# Patient Record
Sex: Female | Born: 1935 | Race: White | Hispanic: No | Marital: Married | Smoking: Former smoker
Health system: Southern US, Community
[De-identification: ages and names within clinical notes are randomized; demographics above are authoritative.]

## PROBLEM LIST (undated history)

## (undated) DIAGNOSIS — I4891 Unspecified atrial fibrillation: Secondary | ICD-10-CM

## (undated) DIAGNOSIS — I10 Essential (primary) hypertension: Secondary | ICD-10-CM

## (undated) DIAGNOSIS — E871 Hypo-osmolality and hyponatremia: Secondary | ICD-10-CM

## (undated) DIAGNOSIS — E785 Hyperlipidemia, unspecified: Secondary | ICD-10-CM

## (undated) DIAGNOSIS — C801 Malignant (primary) neoplasm, unspecified: Secondary | ICD-10-CM

## (undated) DIAGNOSIS — I509 Heart failure, unspecified: Secondary | ICD-10-CM

## (undated) DIAGNOSIS — Z7901 Long term (current) use of anticoagulants: Secondary | ICD-10-CM

## (undated) HISTORY — PX: ABDOMINAL HYSTERECTOMY: SHX81

## (undated) HISTORY — DX: Hyperlipidemia, unspecified: E78.5

## (undated) HISTORY — DX: Hypo-osmolality and hyponatremia: E87.1

## (undated) HISTORY — DX: Unspecified atrial fibrillation: I48.91

## (undated) HISTORY — PX: CHOLECYSTECTOMY: SHX55

## (undated) HISTORY — DX: Long term (current) use of anticoagulants: Z79.01

---

## 1997-09-03 ENCOUNTER — Other Ambulatory Visit: Admission: RE | Admit: 1997-09-03 | Discharge: 1997-09-03 | Payer: Self-pay | Admitting: Endocrinology

## 1998-02-13 ENCOUNTER — Inpatient Hospital Stay (HOSPITAL_COMMUNITY): Admission: AD | Admit: 1998-02-13 | Discharge: 1998-02-14 | Payer: Self-pay | Admitting: Internal Medicine

## 1998-11-05 ENCOUNTER — Other Ambulatory Visit: Admission: RE | Admit: 1998-11-05 | Discharge: 1998-11-05 | Payer: Self-pay | Admitting: Endocrinology

## 1998-11-12 ENCOUNTER — Ambulatory Visit (HOSPITAL_BASED_OUTPATIENT_CLINIC_OR_DEPARTMENT_OTHER): Admission: RE | Admit: 1998-11-12 | Discharge: 1998-11-12 | Payer: Self-pay | Admitting: Plastic Surgery

## 1999-10-27 ENCOUNTER — Other Ambulatory Visit: Admission: RE | Admit: 1999-10-27 | Discharge: 1999-10-27 | Payer: Self-pay | Admitting: Endocrinology

## 2000-11-07 ENCOUNTER — Other Ambulatory Visit: Admission: RE | Admit: 2000-11-07 | Discharge: 2000-11-07 | Payer: Self-pay | Admitting: Endocrinology

## 2002-08-27 ENCOUNTER — Encounter: Admission: RE | Admit: 2002-08-27 | Discharge: 2002-11-25 | Payer: Self-pay | Admitting: Endocrinology

## 2002-09-06 ENCOUNTER — Encounter: Payer: Self-pay | Admitting: Endocrinology

## 2002-09-06 ENCOUNTER — Encounter: Admission: RE | Admit: 2002-09-06 | Discharge: 2002-09-06 | Payer: Self-pay | Admitting: Endocrinology

## 2002-09-26 ENCOUNTER — Encounter (INDEPENDENT_AMBULATORY_CARE_PROVIDER_SITE_OTHER): Payer: Self-pay | Admitting: Specialist

## 2002-09-26 ENCOUNTER — Ambulatory Visit (HOSPITAL_COMMUNITY): Admission: RE | Admit: 2002-09-26 | Discharge: 2002-09-26 | Payer: Self-pay | Admitting: *Deleted

## 2002-10-30 ENCOUNTER — Ambulatory Visit (HOSPITAL_COMMUNITY): Admission: RE | Admit: 2002-10-30 | Discharge: 2002-10-30 | Payer: Self-pay | Admitting: *Deleted

## 2002-11-26 ENCOUNTER — Other Ambulatory Visit: Admission: RE | Admit: 2002-11-26 | Discharge: 2002-11-26 | Payer: Self-pay | Admitting: Endocrinology

## 2004-11-16 ENCOUNTER — Encounter: Admission: RE | Admit: 2004-11-16 | Discharge: 2004-11-16 | Payer: Self-pay | Admitting: Endocrinology

## 2004-11-17 ENCOUNTER — Encounter: Admission: RE | Admit: 2004-11-17 | Discharge: 2004-11-17 | Payer: Self-pay | Admitting: Endocrinology

## 2004-11-26 ENCOUNTER — Encounter: Admission: RE | Admit: 2004-11-26 | Discharge: 2004-11-26 | Payer: Self-pay | Admitting: Endocrinology

## 2005-01-12 ENCOUNTER — Encounter: Admission: RE | Admit: 2005-01-12 | Discharge: 2005-01-12 | Payer: Self-pay | Admitting: Neurosurgery

## 2005-01-14 ENCOUNTER — Encounter: Admission: RE | Admit: 2005-01-14 | Discharge: 2005-01-14 | Payer: Self-pay | Admitting: Neurosurgery

## 2005-04-10 ENCOUNTER — Inpatient Hospital Stay (HOSPITAL_COMMUNITY): Admission: EM | Admit: 2005-04-10 | Discharge: 2005-04-12 | Payer: Self-pay | Admitting: Emergency Medicine

## 2005-05-18 ENCOUNTER — Ambulatory Visit (HOSPITAL_COMMUNITY): Admission: RE | Admit: 2005-05-18 | Discharge: 2005-05-18 | Payer: Self-pay | Admitting: Surgery

## 2005-05-18 ENCOUNTER — Encounter (INDEPENDENT_AMBULATORY_CARE_PROVIDER_SITE_OTHER): Payer: Self-pay | Admitting: *Deleted

## 2007-03-16 ENCOUNTER — Encounter: Admission: RE | Admit: 2007-03-16 | Discharge: 2007-03-16 | Payer: Self-pay | Admitting: *Deleted

## 2008-11-13 ENCOUNTER — Other Ambulatory Visit: Admission: RE | Admit: 2008-11-13 | Discharge: 2008-11-13 | Payer: Self-pay | Admitting: Endocrinology

## 2010-03-30 ENCOUNTER — Other Ambulatory Visit: Payer: Self-pay | Admitting: Dermatology

## 2010-05-28 NOTE — Op Note (Signed)
Alexa Middleton, Alexa Middleton                 ACCOUNT NO.:  [id number]   MEDICAL RECORD NO.:  [id number]          PATIENT TYPE:  AMB   LOCATION:  DAY                          FACILITY:  [facility]   PHYSICIAN:  Wilmon Arms. Corliss Skains, M.D. DATE OF BIRTH:  Jan 06, 1928   DATE OF PROCEDURE:  05/18/2005  DATE OF DISCHARGE:                                 OPERATIVE REPORT   PREOPERATIVE DIAGNOSIS:  Chronic calculus cholecystitis.   POSTOPERATIVE DIAGNOSIS:  Chronic calculus cholecystitis.   PROCEDURE PERFORMED:  Laparoscopic cholecystectomy with intraoperative  cholangiogram.   SURGEON:  Wilmon Arms. Corliss Skains, M.D.   ASSISTANT:  Lebron Conners, M.D.   ANESTHESIA:  General endotracheal.   INDICATIONS:  The patient is a 77 year old female who was recently  hospitalized for right-sided chest pain.  She was found to have a right side  pneumonia, for which she was treated with antibiotics.  She is also noted on  ultrasound to have multiple gallstones including a 15 mm stone on the neck  of the gallbladder.  She had no further abdominal pain.  Her liver function  tests were normal.  She was seen in consultation and the decision was made  to proceed with a laparoscopic cholecystectomy.   DESCRIPTION OF PROCEDURE:  The patient is brought to the operating room and  placed in the supine position on the operating room table.  After an  adequate level of general endotracheal anesthesia was obtained, the  patient's abdomen was prepped with Betadine and draped in sterile fashion.  A time-out was then taken to ensure the proper patient and proper procedure.  The area below her umbilicus was infiltrated with 0.25% Marcaine and  transverse incision was made here.  Dissection was carried down to the  fascia which was opened vertically.  A pursestring suture of 0 Vicryl was  placed around the fascial opening.  A Hassan cannula was inserted and  secured with stay suture.  Pneumoperitoneum was obtained by insufflating  CO2,  maintaining maximal pressure 15 mmHg.  The patient was positioned in  reverse Trendelenburg position rotated slightly to her left.  The visual  examination showed a large uninflamed gallbladder.  The anterior surface of  her liver showed some whitish scarring.  There were no other abnormalities  noted on the liver.  Two 5 mm ports were placed on the right side and a 10  mm port was placed in subxiphoid position.  The gallbladder was grasped with  a clamp and elevated over the edge of the liver.  The peritoneum around the  hilum of the gallbladder was opened after taking down some flimsy omental  adhesions.  The cystic duct was circumferentially dissected and ligated with  a clip distally.  A small opening was created on the cystic duct and a Cook  cholangiogram catheter was inserted and secured with the clip.  A  cholangiogram was obtained which showed good flow proximally and distally in  the biliary tree with good flow into the duodenum.  No filling defects were  noted.  The cholangiogram catheter was removed and the cystic duct was  ligated with three clips and divided.  The cystic artery was also ligated  with clips and divided.  Cautery was then used to remove the gallbladder  from the liver bed.  The gallbladder was then removed through the umbilical  incision.  All the ports were then reinserted.  We reinspected the  gallbladder fossa and no bleeding was noted.  Ports were removed under  direct vision as pneumoperitoneum was released.  The pursestring suture was  used to close the umbilical fascia.  A 4-0  Monocryl was used to close the skin in subcuticular fashion.  All the ports  sites had been previously infiltrated 0.25% Marcaine.  Steri-Strips and  clean dressings were applied.  The patient was then extubated and brought to  the recovery room in stable condition.  All sponge, needle and instrument  counts were correct.      Wilmon Arms. Tsuei, M.D.  Electronically  Signed     MKT/MEDQ  D:  05/18/2005  T:  05/19/2005  Job:  [id number]   cc:   Brooke Bonito, M.D.  Fax: [phone]

## 2010-05-28 NOTE — Discharge Summary (Signed)
Alexa Middleton, WHINERY NO.:  [id number]   MEDICAL RECORD NO.:  [id number]          PATIENT TYPE:  INP   LOCATION:  5524                         FACILITY:  [facility]   PHYSICIAN:  Nelma Rothman, MD   DATE OF BIRTH:  01-30-1927   DATE OF ADMISSION:  04/10/2005  DATE OF DISCHARGE:  04/12/2005                                 DISCHARGE SUMMARY   PRIMARY CARE PHYSICIAN:  Brooke Bonito, M.D. of [facility]   DISCHARGE DIAGNOSES:  1.  Community-acquired right upper lobe pneumonia.  2.  Hypertension.  3.  History of dyslipidemia.   PROCEDURES:  1.  Two views of the abdomen, on admission, demonstrated no acute abdominal      process by plain film assessment.  2.  Abdominal ultrasound on April1 demonstrated cholelithiasis as well as 1      stone which appeared to be located in the neck of the gallbladder.      There was a positive ultrasound Murphy's sign.  However, there was no      evidence of cholecystitis as the common duct was well within normal      limits; and there was no gallbladder wall thickening.  3.  CT angiogram of the chest on April1 demonstrated a right upper lobar      pneumonia with consolidation, negative for pulmonary embolus, there was      multiple peripheral, subpleural blebs consistent with emphysema.   LABORATORY DATA ON ADMISSION:  White blood cell count was 26.6 this was  trending down with a white blood cell count of 17.7 on the morning of  discharge.  The D-dimer was slightly elevated on admission at 1.00.  Her  creatinine was 0.8.  Liver function tests were within normal limits.  The  hemoglobin A1c was 7.8.  TSH 0.690 and a vitamin B12 level of 680.   HISTORY AND PHYSICAL:  Please see dictated admission history and physical by  Dr. Robb Matar on Alex Gardener; but, briefly, Alexa Middleton is a very pleasant 77-  year-old lady who was admitted with fever as well as cough and some mild  abdominal pain.  CT on presentation was  consistent with a right upper lobe  pneumonia and she was admitted for further management.   HOSPITAL COURSE AS ARRANGED BY SYSTEMS:  1.  Community-acquired right upper lobe pneumonia.  The patient was started      on IV ceftriaxone and has completed 3 days of this so far.  With this,      her white blood cell count is trending down.  She has been afebrile      since arriving to the floor, despite having a fever of 101.2 in the      emergency department.  She has remained on room air throughout this      hospitalization.  At this time, I think it would be appropriate to      change her over to oral antibiotics to complete a total of a 10-day      course.  I have switched her to Avelox 400  mg daily for an additional 7      days.  2.  Cholelithiasis.  The patient did endorse a history of upper abdominal      pain which is intermittent in nature.  It resolved spontaneously while      she was here in the hospital.  It was not associated with any other      symptoms.  Acute abdominal films were negative for any sort of      obstruction.  However, abdominal ultrasound did demonstrate multiple      gallstones, one of which appeared to be at the neck of the gallbladder,      likely resulting in biliary colic accounting for her pain.  It has been      recommended that she undergo outpatient elective cholecystectomy at her      convenience.  I have given her the phone number for [facility]      [facility]; and she will speak with Dr. Juleen China for a referral for this.  3.  History of temporal arteritis.  The patient was maintained on prednisone      20 mg daily as she had been on previously for her hospitalization.  A      sed rate was checked during this admission, but is still pending.      Nevertheless, I think it will be difficult to interpret in the setting      of an acute infection, and so it is unlikely to be helpful in any event.      The patient states that she has had weekly sed rates  checked and      followed by Dr. Juleen China; and is scheduled to have another 1 in      approximately 2 days.  I have asked her to call Dr. Marylen Ponto office in      the next day or two to see if she should even have this drawn.  I am      concerned that an elevated ESR may not be secondary to a flare of her      temporal arteritis per se, but rather ongoing infection.  4.  Hypertension.  This has been well controlled on her home dose of Altace      throughout her hospitalization.  5.  Hyperglycemia.  The patient was noted to be mildly hyperglycemic on a.m.      laboratories.  A hemoglobin A1c was checked and was elevated at 7.8.  As      I suspect she will be on prednisone for some time for her temporal      arteritis, it would be reasonable to start a low dose of metformin to      increase insulin sensitivity.  However, I will leave this to the      discretion of Dr. Juleen China as she is followed closely.   DISCHARGE MEDICATIONS:  1.  Avelox 400 mg p.o. daily x7 additional days.  2.  Prednisone 20 mg p.o. daily until follow up with Dr. Juleen China on April18  3.  Altace 5 mg p.o. daily.  4.  Lipitor 40 mg p.o. daily.   DISCHARGE INSTRUCTIONS:  The patient should follow up with Dr. Juleen China, as  previously scheduled, on April18,2007.  I have also provided her with the  phone number for [facility] for referral for elective  cholecystectomy.  She will also speak with Dr. Juleen China about possibly starting  metformin for other oral hypoglycemic agent for presumed  steroid-induced  hyperglycemia.      Nelma Rothman, MD  Electronically Signed     RAR/MEDQ  D:  04/12/2005  T:  04/13/2005  Job:  [id number]   cc:   Brooke Bonito, M.D.  Fax: [phone]

## 2010-05-28 NOTE — Op Note (Signed)
   NAME:  [name], Alexa Middleton                           ACCOUNT NO.:  [id number]   MEDICAL RECORD NO.:  [id number]                   PATIENT TYPE:  AMB   LOCATION:  ENDO                                 FACILITY:  [facility]   PHYSICIAN:  Georgiana Spinner, M.D.                 DATE OF BIRTH:  1927/11/17   DATE OF PROCEDURE:  09/26/2002  DATE OF DISCHARGE:                                 OPERATIVE REPORT   PROCEDURE PERFORMED:  Upper endoscopy with biopsy.   ENDOSCOPIST:  Georgiana Spinner, M.D.   INDICATIONS FOR PROCEDURE:  Weight loss, abdominal pain.   ANESTHESIA:  Demerol 50 mg, Versed 6 mg.   DESCRIPTION OF PROCEDURE:  With the patient mildly sedated in the left  lateral decubitus position, the Olympus video endoscope was inserted in the  mouth and passed under direct vision through the esophagus which appeared  normal until we reached the distal esophagus and there was a stricture seen  that was benign but it was circumferential and it was photographed.  We  advanced past this area through a small hiatal hernia.  The fundus, body,  antrum, duodenal bulb and second portion of the duodenum were visualized and  appeared grossly normal.  From this point, the endoscope was slowly  withdrawn taking circumferential views of the duodenal mucosa visualized  until the endoscope was pulled back into the stomach and placed on  retroflexion to view the stomach from below and once again, the hiatal  hernia was visualized from below.  The endoscope was then straightened and  withdrawn taking circumferential views of the remaining gastric and  esophageal mucosa stopping in the distal esophagus distal to the stricture,  where it appeared that there may have been areas of Barrett's esophagus,  photographed and biopsies taken.  The endoscope was then withdrawn, taking  circumferential views of the remaining esophageal mucosa.  The patient's  vital signs and pulse oximeter remained stable.  The patient tolerated  the  procedure well without apparent complications.   FINDINGS:  Esophageal stricture, benign-appearing with question of Barrett's  esophagus just distal to this.   PLAN:  Await biopsy report.  The patient will call me for results and follow  up with me as an outpatient.                                               Georgiana Spinner, M.D.    GMO/MEDQ  D:  09/26/2002  T:  09/27/2002  Job:  [id number]

## 2010-05-28 NOTE — H&P (Signed)
NAMEKENESHIA, TENA NO.:  [id number]   MEDICAL RECORD NO.:  [id number]          PATIENT TYPE:  EMS   LOCATION:  MAJO                         FACILITY:  [facility]   PHYSICIAN:  Jonna L. Robb Matar, M.D.DATE OF BIRTH:  20-Jul-1927   DATE OF ADMISSION:  04/10/2005  DATE OF DISCHARGE:                                HISTORY & PHYSICAL   PRIMARY CARE PHYSICIAN:  Brooke Bonito, M.D.   NEUROLOGIST:  Gustavus Messing. Orlin Hilding, M.D.   CHIEF COMPLAINT:  Fever.   HISTORY OF PRESENT ILLNESS:  This 77 year old Caucasian female had a one-day  history of fever, slight nonproductive cough, shortness of breath.  No  nausea, vomiting, diarrhea, constipation.  She had some mild right upper  quadrant pain, but is seems to have gotten better.  No chest pain, no  hemoptysis, no history of aspiration.   PAST MEDICAL HISTORY:  1.  Diverticulosis.  2.  Esophageal stricture.  3.  COPD.  4.  Hypertension.  5.  Hypercholesterolemia.  6.  Temporal arteritis.  This was diagnosed several months ago because she      was having severe headaches, and her sed rate was 101.  She has been      treated, and the most recent sed rate is down to 19.   PAST SURGICAL HISTORY:  Hysterectomy with ovariectomy.  This was done for  fibroids.   FAMILY HISTORY:  Heart disease.  One sister has thyroid problems.  One  sister had an abdominal aortic aneurysm repair.   SOCIAL HISTORY:  She is an ex-smoker.  She quit in 1975.  Nondrinker.  No  drugs.  She has been married for 55 years and has one son.   ALLERGIES:  NONE.   MEDICATIONS:  1.  Altace 5 daily.  2.  Lasix 80 mg one-half to one daily p.r.n., and she says she has not been      using it.  Initially, when she went on some prednisone, she did have      some problems with edema, but she has not had any recently.  3.  Lipitor 40 daily.  4.  Prednisone 10 daily.  5.  Vicodin 5/500 p.r.n.   REVIEW OF SYSTEMS:  Apparently, she had a 30 pound weight loss  for  unexplained reasons.  She had a very thorough workup.  No source was ever  found, and then the patient regained the weight.  Other than the history of  present illness, the rest of the 12-system review is negative.   PHYSICAL EXAMINATION:  VITAL SIGNS:  Temperature 101.2, pulse 92,  respirations 22, blood pressure 126/52, 96% O2 saturation.  HEENT:  Conjunctivae and lids are normal.  Pupils are reactive.  Extraocular  movements are full.  She has normal hearing, mucosa, and pharynx.  NECK:  No mass, thyromegaly, or carotid bruits.  LUNGS:  Respiratory effort is normal.  The lungs have a few scattered  crackles on the right side.  No wheezing or dullness.  HEART:  Tachycardic.  Normal S1 and S2 without murmurs, rubs, or gallops.  There  were no bruits.  EXTREMITIES:  No clubbing, cyanosis, or edema.  BREASTS:  No masses, tenderness, or discharge.  ABDOMEN:  Very slight right upper quadrant soreness if you really push into  the gallbladder area.  There is no hepatosplenomegaly or hernia.  Genitalia  could not be examined since the patient was in the hallway.  NEUROLOGIC:  There was no cervical adenopathy.  Muscle strength is 5/5 with  full range of motion in all four extremities.  Cranial nerve are intact.  DTRs are 2+ and equal.  Sensation is normal.  The patient is alert and  oriented x3.  Normal memory, judgment, and affect.  SKIN:  No rash, lesions, or nodules.   LABORATORY DATA:  [facility]  revealed a white count of 20.9 and now up to 26.6; normal hemoglobin and  platelets.  Sodium 129, potassium 3.9, BUN 13, creatinine 1.1, glucose 179.  D-dimer 1.00.  LFTs and INR were normal.   EKG just showed a sinus tachycardia and incomplete right bundle branch  block.  Abdominal x-ray was normal.  Ultrasound of the gallbladder showed  multiple gallstones, normal duct caliber, and a positive Murphy's sign.  Chest x-ray at [facility] showed  right upper lobe consolidation, and it was  peripheral in a wedge shape, so they were concerned that she might have had  a pulmonary infarct.  CT of the chest here showed a dense right upper lobe  consolidation, no PE.  It also showed COPD and some interstitial fibrosis.   IMPRESSION:  1.  Right upper lobe pneumonia.  The patient will be started on Rocephin;      however, consideration should be given to changing to vancomycin if she      does not respond to the Rocephin.  2.  Chronic obstructive pulmonary disease.  3.  Cholelithiasis.  She does not really seem to have an acute cholecystitis      at present.  She should get an outpatient appointment with [facility] and have a cholecystectomy on an elective basis.  4.  Temporal arteritis.  I am going to double her prednisone for three days      and then taper back down so as to avoid any Addisonian reaction.  5.  Esophageal stricture.  6.  Hypertension.  7.  Hypercholesterolemia.      Jonna L. Robb Matar, M.D.  Electronically Signed     JLB/MEDQ  D:  04/10/2005  T:  04/12/2005  Job:  [id number]   cc:   Brooke Bonito, M.D.  Fax: [phone]   Gustavus Messing. Orlin Hilding, M.D.  Fax: [phone]

## 2010-05-28 NOTE — Op Note (Signed)
   NAME:  Alexa Middleton, Alexa Middleton                           ACCOUNT NO.:  [id number]   MEDICAL RECORD NO.:  [id number]                   PATIENT TYPE:  AMB   LOCATION:  ENDO                                 FACILITY:  [facility]   PHYSICIAN:  Georgiana Spinner, M.D.                 DATE OF BIRTH:  May 26, 1927   DATE OF PROCEDURE:  DATE OF DISCHARGE:                                 OPERATIVE REPORT   PROCEDURE:  Colonoscopy.   INDICATIONS:  Hemoccult-positivity.   ANESTHESIA:  Demerol 60 mg, Versed 6 mg.   DESCRIPTION OF PROCEDURE:  With the patient mildly sedated and in the left  lateral decubitus position, the Olympus videoscopic colonoscope was inserted  in the rectum and passed under direct vision to the cecum, identified by  ileocecal valve and appendiceal orifice, both of which were photographed.  From this point,  the colonoscope was slowly withdrawn, taking  circumferential views of the colonic mucosa, stopping only to photograph  diverticula seen in the sigmoid colon, until we reached the rectum which  appeared normal  on direct and showed hemorrhoids on retroflexed view.  The  endoscope was straightened and withdrawn.  The patient's vital signs and  pulse oximetry remained stable.  The patient tolerated the procedure well  without apparent complications.   FINDINGS:  1. Internal hemorrhoids.  2. Diverticulosis in the sigmoid colon.  3. Otherwise unremarkable exam.   PLAN:  Will have the patient follow up with me and her primary care  physician as an outpatient.                                               Georgiana Spinner, M.D.    GMO/MEDQ  D:  10/30/2002  T:  10/30/2002  Job:  [id number]

## 2011-04-18 ENCOUNTER — Other Ambulatory Visit: Payer: Self-pay | Admitting: Dermatology

## 2011-05-31 ENCOUNTER — Other Ambulatory Visit: Payer: Self-pay | Admitting: Dermatology

## 2011-10-17 ENCOUNTER — Other Ambulatory Visit: Payer: Self-pay | Admitting: Dermatology

## 2012-05-15 ENCOUNTER — Other Ambulatory Visit: Payer: Self-pay | Admitting: Dermatology

## 2012-11-01 ENCOUNTER — Other Ambulatory Visit: Payer: Self-pay | Admitting: Dermatology

## 2013-03-06 ENCOUNTER — Other Ambulatory Visit: Payer: Self-pay | Admitting: Dermatology

## 2013-03-19 ENCOUNTER — Other Ambulatory Visit: Payer: Self-pay | Admitting: Dermatology

## 2013-04-23 ENCOUNTER — Other Ambulatory Visit: Payer: Self-pay | Admitting: Dermatology

## 2013-06-18 ENCOUNTER — Other Ambulatory Visit: Payer: Self-pay | Admitting: Dermatology

## 2013-09-02 ENCOUNTER — Other Ambulatory Visit: Payer: Self-pay | Admitting: Dermatology

## 2013-09-05 ENCOUNTER — Inpatient Hospital Stay (HOSPITAL_COMMUNITY): Payer: Medicare Other | Admitting: Certified Registered Nurse Anesthetist

## 2013-09-05 ENCOUNTER — Emergency Department (HOSPITAL_COMMUNITY): Payer: Medicare Other

## 2013-09-05 ENCOUNTER — Encounter (HOSPITAL_COMMUNITY)
Admission: EM | Disposition: A | Discharge status: Skilled Nursing Facility | Payer: Self-pay | Source: Home / Self Care | Attending: Internal Medicine

## 2013-09-05 ENCOUNTER — Encounter (HOSPITAL_COMMUNITY): Payer: Medicare Other | Admitting: Certified Registered Nurse Anesthetist

## 2013-09-05 ENCOUNTER — Inpatient Hospital Stay (HOSPITAL_COMMUNITY)
Admission: EM | Admit: 2013-09-05 | Discharge: 2013-09-13 | DRG: 469 | Disposition: A | Discharge status: Skilled Nursing Facility | Payer: Medicare Other | Attending: Internal Medicine | Admitting: Internal Medicine

## 2013-09-05 ENCOUNTER — Encounter (HOSPITAL_COMMUNITY): Payer: Self-pay | Admitting: Emergency Medicine

## 2013-09-05 ENCOUNTER — Inpatient Hospital Stay (HOSPITAL_COMMUNITY): Payer: Medicare Other

## 2013-09-05 DIAGNOSIS — Z87891 Personal history of nicotine dependence: Secondary | ICD-10-CM

## 2013-09-05 DIAGNOSIS — Z8249 Family history of ischemic heart disease and other diseases of the circulatory system: Secondary | ICD-10-CM

## 2013-09-05 DIAGNOSIS — E785 Hyperlipidemia, unspecified: Secondary | ICD-10-CM

## 2013-09-05 DIAGNOSIS — E871 Hypo-osmolality and hyponatremia: Secondary | ICD-10-CM

## 2013-09-05 DIAGNOSIS — M25559 Pain in unspecified hip: Secondary | ICD-10-CM | POA: Diagnosis present

## 2013-09-05 DIAGNOSIS — E875 Hyperkalemia: Secondary | ICD-10-CM | POA: Diagnosis present

## 2013-09-05 DIAGNOSIS — W010XXA Fall on same level from slipping, tripping and stumbling without subsequent striking against object, initial encounter: Secondary | ICD-10-CM | POA: Diagnosis present

## 2013-09-05 DIAGNOSIS — J189 Pneumonia, unspecified organism: Secondary | ICD-10-CM

## 2013-09-05 DIAGNOSIS — Y92009 Unspecified place in unspecified non-institutional (private) residence as the place of occurrence of the external cause: Secondary | ICD-10-CM

## 2013-09-05 DIAGNOSIS — I10 Essential (primary) hypertension: Secondary | ICD-10-CM | POA: Diagnosis present

## 2013-09-05 DIAGNOSIS — I4892 Unspecified atrial flutter: Secondary | ICD-10-CM | POA: Diagnosis not present

## 2013-09-05 DIAGNOSIS — R000 Tachycardia, unspecified: Secondary | ICD-10-CM

## 2013-09-05 DIAGNOSIS — R7309 Other abnormal glucose: Secondary | ICD-10-CM | POA: Diagnosis not present

## 2013-09-05 DIAGNOSIS — I959 Hypotension, unspecified: Secondary | ICD-10-CM | POA: Diagnosis not present

## 2013-09-05 DIAGNOSIS — I4891 Unspecified atrial fibrillation: Secondary | ICD-10-CM

## 2013-09-05 DIAGNOSIS — D62 Acute posthemorrhagic anemia: Secondary | ICD-10-CM

## 2013-09-05 DIAGNOSIS — Z96649 Presence of unspecified artificial hip joint: Secondary | ICD-10-CM

## 2013-09-05 DIAGNOSIS — R0902 Hypoxemia: Secondary | ICD-10-CM | POA: Diagnosis present

## 2013-09-05 DIAGNOSIS — I484 Atypical atrial flutter: Secondary | ICD-10-CM

## 2013-09-05 DIAGNOSIS — Z85828 Personal history of other malignant neoplasm of skin: Secondary | ICD-10-CM | POA: Diagnosis not present

## 2013-09-05 DIAGNOSIS — I472 Ventricular tachycardia: Secondary | ICD-10-CM | POA: Diagnosis not present

## 2013-09-05 DIAGNOSIS — J690 Pneumonitis due to inhalation of food and vomit: Secondary | ICD-10-CM | POA: Diagnosis present

## 2013-09-05 DIAGNOSIS — S72012A Unspecified intracapsular fracture of left femur, initial encounter for closed fracture: Secondary | ICD-10-CM

## 2013-09-05 DIAGNOSIS — S72033A Displaced midcervical fracture of unspecified femur, initial encounter for closed fracture: Secondary | ICD-10-CM | POA: Diagnosis present

## 2013-09-05 DIAGNOSIS — J438 Other emphysema: Secondary | ICD-10-CM | POA: Diagnosis present

## 2013-09-05 DIAGNOSIS — D72829 Elevated white blood cell count, unspecified: Secondary | ICD-10-CM

## 2013-09-05 DIAGNOSIS — I480 Paroxysmal atrial fibrillation: Secondary | ICD-10-CM

## 2013-09-05 DIAGNOSIS — S72009A Fracture of unspecified part of neck of unspecified femur, initial encounter for closed fracture: Secondary | ICD-10-CM | POA: Diagnosis present

## 2013-09-05 DIAGNOSIS — I498 Other specified cardiac arrhythmias: Secondary | ICD-10-CM | POA: Diagnosis present

## 2013-09-05 HISTORY — DX: Essential (primary) hypertension: I10

## 2013-09-05 HISTORY — DX: Malignant (primary) neoplasm, unspecified: C80.1

## 2013-09-05 HISTORY — PX: HIP ARTHROPLASTY: SHX981

## 2013-09-05 LAB — CBC WITH DIFFERENTIAL/PLATELET
Basophils Absolute: 0 10*3/uL (ref 0.0–0.1)
Basophils Relative: 0 % (ref 0–1)
EOS PCT: 2 % (ref 0–5)
Eosinophils Absolute: 0.2 10*3/uL (ref 0.0–0.7)
HCT: 39.5 % (ref 36.0–46.0)
HEMOGLOBIN: 13.3 g/dL (ref 12.0–15.0)
LYMPHS ABS: 0.8 10*3/uL (ref 0.7–4.0)
LYMPHS PCT: 8 % — AB (ref 12–46)
MCH: 29.2 pg (ref 26.0–34.0)
MCHC: 33.7 g/dL (ref 30.0–36.0)
MCV: 86.8 fL (ref 78.0–100.0)
Monocytes Absolute: 0.6 10*3/uL (ref 0.1–1.0)
Monocytes Relative: 6 % (ref 3–12)
NEUTROS PCT: 84 % — AB (ref 43–77)
Neutro Abs: 8.4 10*3/uL — ABNORMAL HIGH (ref 1.7–7.7)
PLATELETS: 308 10*3/uL (ref 150–400)
RBC: 4.55 MIL/uL (ref 3.87–5.11)
RDW: 14.6 % (ref 11.5–15.5)
WBC: 10.1 10*3/uL (ref 4.0–10.5)

## 2013-09-05 LAB — TYPE AND SCREEN
ABO/RH(D): O POS
ANTIBODY SCREEN: NEGATIVE

## 2013-09-05 LAB — URINALYSIS, ROUTINE W REFLEX MICROSCOPIC
Bilirubin Urine: NEGATIVE
Glucose, UA: NEGATIVE mg/dL
Hgb urine dipstick: NEGATIVE
Ketones, ur: NEGATIVE mg/dL
Leukocytes, UA: NEGATIVE
Nitrite: NEGATIVE
Protein, ur: NEGATIVE mg/dL
Specific Gravity, Urine: 1.014 (ref 1.005–1.030)
Urobilinogen, UA: 0.2 mg/dL (ref 0.0–1.0)
pH: 6 (ref 5.0–8.0)

## 2013-09-05 LAB — SURGICAL PCR SCREEN
MRSA, PCR: NEGATIVE
Staphylococcus aureus: NEGATIVE

## 2013-09-05 LAB — BASIC METABOLIC PANEL
Anion gap: 13 (ref 5–15)
BUN: 12 mg/dL (ref 6–23)
CO2: 26 meq/L (ref 19–32)
Calcium: 8.9 mg/dL (ref 8.4–10.5)
Chloride: 92 mEq/L — ABNORMAL LOW (ref 96–112)
Creatinine, Ser: 0.56 mg/dL (ref 0.50–1.10)
GFR calc Af Amer: >90 mL/min (ref >90)
GFR calc non Af Amer: 83 mL/min — ABNORMAL LOW (ref >90)
Glucose, Bld: 191 mg/dL — ABNORMAL HIGH (ref 70–99)
POTASSIUM: 4.2 meq/L (ref 3.7–5.3)
SODIUM: 131 meq/L — AB (ref 137–147)

## 2013-09-05 LAB — ABO/RH: ABO/RH(D): O POS

## 2013-09-05 SURGERY — HEMIARTHROPLASTY, HIP, DIRECT ANTERIOR APPROACH, FOR FRACTURE
Anesthesia: General | Site: Hip | Laterality: Left

## 2013-09-05 MED ORDER — ACETAMINOPHEN 325 MG PO TABS: 650.0000 mg | ORAL_TABLET | Freq: Four times a day (QID) | ORAL | Status: DC | PRN

## 2013-09-05 MED ORDER — MORPHINE SULFATE 2 MG/ML IJ SOLN: 2.0000 mg | INTRAMUSCULAR | Status: DC | PRN

## 2013-09-05 MED ORDER — ROCURONIUM BROMIDE 100 MG/10ML IV SOLN
INTRAVENOUS | Status: DC | PRN
  Administered 2013-09-05: 20 mg via INTRAVENOUS
  Administered 2013-09-05: 10 mg via INTRAVENOUS

## 2013-09-05 MED ORDER — ONDANSETRON HCL 4 MG PO TABS: 4.0000 mg | ORAL_TABLET | Freq: Four times a day (QID) | ORAL | Status: DC | PRN

## 2013-09-05 MED ORDER — FENTANYL CITRATE 0.05 MG/ML IJ SOLN
50.0000 ug | Freq: Once | INTRAMUSCULAR | Status: AC
  Administered 2013-09-05: 50 ug via INTRAVENOUS
  Filled 2013-09-05: qty 2

## 2013-09-05 MED ORDER — ATROPINE SULFATE 0.4 MG/ML IJ SOLN
INTRAMUSCULAR | Status: AC
  Filled 2013-09-05: qty 1

## 2013-09-05 MED ORDER — ONDANSETRON HCL 4 MG/2ML IJ SOLN
INTRAMUSCULAR | Status: AC
  Filled 2013-09-05: qty 2

## 2013-09-05 MED ORDER — LACTATED RINGERS IV SOLN: INTRAVENOUS | Status: DC

## 2013-09-05 MED ORDER — MORPHINE SULFATE 4 MG/ML IJ SOLN
4.0000 mg | Freq: Once | INTRAMUSCULAR | Status: AC
  Administered 2013-09-05: 4 mg via INTRAVENOUS
  Filled 2013-09-05: qty 1

## 2013-09-05 MED ORDER — GLYCOPYRROLATE 0.2 MG/ML IJ SOLN
INTRAMUSCULAR | Status: DC | PRN
  Administered 2013-09-05: 0.4 mg via INTRAVENOUS

## 2013-09-05 MED ORDER — SODIUM CHLORIDE 0.9 % IJ SOLN
INTRAMUSCULAR | Status: DC | PRN
  Administered 2013-09-05: 30 mL via INTRAVENOUS

## 2013-09-05 MED ORDER — ALUM & MAG HYDROXIDE-SIMETH 200-200-20 MG/5ML PO SUSP: 30.0000 mL | Freq: Four times a day (QID) | ORAL | Status: DC | PRN

## 2013-09-05 MED ORDER — BUPIVACAINE HCL (PF) 0.25 % IJ SOLN
INTRAMUSCULAR | Status: AC
  Filled 2013-09-05: qty 30

## 2013-09-05 MED ORDER — HYDROCODONE-ACETAMINOPHEN 5-325 MG PO TABS: 1.0000 | ORAL_TABLET | ORAL | Status: DC | PRN

## 2013-09-05 MED ORDER — ATORVASTATIN CALCIUM 10 MG PO TABS: 10.0000 mg | ORAL_TABLET | Freq: Every day | ORAL | Status: DC

## 2013-09-05 MED ORDER — ONDANSETRON HCL 4 MG/2ML IJ SOLN: 4.0000 mg | Freq: Three times a day (TID) | INTRAMUSCULAR | Status: DC | PRN

## 2013-09-05 MED ORDER — BUPIVACAINE HCL (PF) 0.25 % IJ SOLN
INTRAMUSCULAR | Status: DC | PRN
  Administered 2013-09-05: 20 mL

## 2013-09-05 MED ORDER — LIDOCAINE HCL (CARDIAC) 20 MG/ML IV SOLN
INTRAVENOUS | Status: DC | PRN
  Administered 2013-09-05: 60 mg via INTRAVENOUS

## 2013-09-05 MED ORDER — SODIUM CHLORIDE 0.9 % IV SOLN: INTRAVENOUS | Status: DC

## 2013-09-05 MED ORDER — SODIUM CHLORIDE 0.9 % IV SOLN
INTRAVENOUS | Status: DC
  Administered 2013-09-05: 18:00:00 via INTRAVENOUS

## 2013-09-05 MED ORDER — LOSARTAN POTASSIUM 50 MG PO TABS
100.0000 mg | ORAL_TABLET | Freq: Every day | ORAL | Status: DC
  Administered 2013-09-06 – 2013-09-08 (×3): 100 mg via ORAL
  Filled 2013-09-05 (×3): qty 2

## 2013-09-05 MED ORDER — SODIUM CHLORIDE 0.9 % IJ SOLN
INTRAMUSCULAR | Status: AC
  Filled 2013-09-05: qty 50

## 2013-09-05 MED ORDER — SODIUM CHLORIDE 0.9 % IV SOLN
250.0000 mL | INTRAVENOUS | Status: DC | PRN
  Administered 2013-09-05: 22:00:00 via INTRAVENOUS

## 2013-09-05 MED ORDER — FENTANYL CITRATE 0.05 MG/ML IJ SOLN
INTRAMUSCULAR | Status: AC
  Filled 2013-09-05: qty 5

## 2013-09-05 MED ORDER — FENTANYL CITRATE 0.05 MG/ML IJ SOLN
INTRAMUSCULAR | Status: DC | PRN
  Administered 2013-09-05 (×4): 50 ug via INTRAVENOUS

## 2013-09-05 MED ORDER — ENOXAPARIN SODIUM 40 MG/0.4ML ~~LOC~~ SOLN
40.0000 mg | SUBCUTANEOUS | Status: DC
  Filled 2013-09-05: qty 0.4

## 2013-09-05 MED ORDER — ATORVASTATIN CALCIUM 10 MG PO TABS
10.0000 mg | ORAL_TABLET | Freq: Every day | ORAL | Status: DC
  Administered 2013-09-06 – 2013-09-11 (×6): 10 mg via ORAL
  Filled 2013-09-05 (×10): qty 1

## 2013-09-05 MED ORDER — ROCURONIUM BROMIDE 100 MG/10ML IV SOLN
INTRAVENOUS | Status: AC
  Filled 2013-09-05: qty 1

## 2013-09-05 MED ORDER — CEFAZOLIN SODIUM-DEXTROSE 2-3 GM-% IV SOLR
INTRAVENOUS | Status: AC
  Filled 2013-09-05: qty 50

## 2013-09-05 MED ORDER — NEOSTIGMINE METHYLSULFATE 10 MG/10ML IV SOLN
INTRAVENOUS | Status: DC | PRN
  Administered 2013-09-05: 3 mg via INTRAVENOUS

## 2013-09-05 MED ORDER — SODIUM CHLORIDE 0.9 % IJ SOLN: 3.0000 mL | INTRAMUSCULAR | Status: DC | PRN

## 2013-09-05 MED ORDER — AMLODIPINE BESYLATE 10 MG PO TABS
10.0000 mg | ORAL_TABLET | Freq: Every day | ORAL | Status: DC
  Administered 2013-09-06 – 2013-09-08 (×3): 10 mg via ORAL
  Filled 2013-09-05 (×3): qty 1

## 2013-09-05 MED ORDER — AMLODIPINE BESYLATE 10 MG PO TABS: 10.0000 mg | ORAL_TABLET | Freq: Every day | ORAL | Status: DC

## 2013-09-05 MED ORDER — SENNOSIDES-DOCUSATE SODIUM 8.6-50 MG PO TABS: 1.0000 | ORAL_TABLET | Freq: Every evening | ORAL | Status: DC | PRN

## 2013-09-05 MED ORDER — BUPIVACAINE LIPOSOME 1.3 % IJ SUSP: 20.0000 mL | Freq: Once | INTRAMUSCULAR | Status: DC

## 2013-09-05 MED ORDER — HYDROMORPHONE HCL PF 1 MG/ML IJ SOLN: 0.2500 mg | INTRAMUSCULAR | Status: DC | PRN

## 2013-09-05 MED ORDER — ONDANSETRON HCL 4 MG/2ML IJ SOLN
4.0000 mg | Freq: Four times a day (QID) | INTRAMUSCULAR | Status: DC | PRN
Start: 2013-09-05 — End: 2013-09-06

## 2013-09-05 MED ORDER — PHENYLEPHRINE HCL 10 MG/ML IJ SOLN
INTRAMUSCULAR | Status: DC | PRN
  Administered 2013-09-05 (×4): 80 ug via INTRAVENOUS

## 2013-09-05 MED ORDER — DEXAMETHASONE SODIUM PHOSPHATE 10 MG/ML IJ SOLN
INTRAMUSCULAR | Status: DC | PRN
  Administered 2013-09-05: 10 mg via INTRAVENOUS

## 2013-09-05 MED ORDER — CEFAZOLIN SODIUM-DEXTROSE 2-3 GM-% IV SOLR
2.0000 g | INTRAVENOUS | Status: AC
Start: 2013-09-05 — End: 2013-09-05
  Administered 2013-09-05: 2 g via INTRAVENOUS

## 2013-09-05 MED ORDER — PROPOFOL 10 MG/ML IV BOLUS
INTRAVENOUS | Status: AC
  Filled 2013-09-05: qty 20

## 2013-09-05 MED ORDER — BUPIVACAINE LIPOSOME 1.3 % IJ SUSP
20.0000 mL | Freq: Once | INTRAMUSCULAR | Status: AC
  Administered 2013-09-05: 20 mL
  Filled 2013-09-05: qty 20

## 2013-09-05 MED ORDER — ACETAMINOPHEN 650 MG RE SUPP: 650.0000 mg | Freq: Four times a day (QID) | RECTAL | Status: DC | PRN

## 2013-09-05 MED ORDER — ONDANSETRON HCL 4 MG/2ML IJ SOLN
INTRAMUSCULAR | Status: DC | PRN
  Administered 2013-09-05: 4 mg via INTRAVENOUS

## 2013-09-05 MED ORDER — LOSARTAN POTASSIUM 50 MG PO TABS: 100.0000 mg | ORAL_TABLET | Freq: Every day | ORAL | Status: DC

## 2013-09-05 MED ORDER — ACETAMINOPHEN 10 MG/ML IV SOLN
INTRAVENOUS | Status: DC | PRN
  Administered 2013-09-05: 1000 mg via INTRAVENOUS

## 2013-09-05 MED ORDER — ONDANSETRON HCL 4 MG/2ML IJ SOLN
4.0000 mg | Freq: Once | INTRAMUSCULAR | Status: AC
  Administered 2013-09-05: 4 mg via INTRAVENOUS
  Filled 2013-09-05: qty 2

## 2013-09-05 MED ORDER — TETANUS-DIPHTH-ACELL PERTUSSIS 5-2.5-18.5 LF-MCG/0.5 IM SUSP
0.5000 mL | Freq: Once | INTRAMUSCULAR | Status: AC
  Administered 2013-09-05: 0.5 mL via INTRAMUSCULAR
  Filled 2013-09-05: qty 0.5

## 2013-09-05 MED ORDER — SODIUM CHLORIDE 0.9 % IJ SOLN
3.0000 mL | Freq: Two times a day (BID) | INTRAMUSCULAR | Status: DC
  Administered 2013-09-07 – 2013-09-13 (×13): 3 mL via INTRAVENOUS

## 2013-09-05 MED ORDER — PROPOFOL 10 MG/ML IV BOLUS
INTRAVENOUS | Status: DC | PRN
  Administered 2013-09-05: 20 mg via INTRAVENOUS
  Administered 2013-09-05: 120 mg via INTRAVENOUS

## 2013-09-05 MED ORDER — SUCCINYLCHOLINE CHLORIDE 20 MG/ML IJ SOLN
INTRAMUSCULAR | Status: DC | PRN
  Administered 2013-09-05: 80 mg via INTRAVENOUS

## 2013-09-05 MED ORDER — LACTATED RINGERS IV SOLN
INTRAVENOUS | Status: DC | PRN
  Administered 2013-09-05: 22:00:00 via INTRAVENOUS

## 2013-09-05 SURGICAL SUPPLY — 51 items
BAG ZIPLOCK 12X15 (MISCELLANEOUS) ×3 IMPLANT
BIT DRILL 2.8X128 (BIT) ×2 IMPLANT
BIT DRILL 2.8X128MM (BIT) ×1
BLADE SAW SAG 73X25 THK (BLADE) ×2
BLADE SAW SGTL 73X25 THK (BLADE) ×1 IMPLANT
BRUSH FEMORAL CANAL (MISCELLANEOUS) ×3 IMPLANT
CAPT HIP FX BIPOLAR/UNIPOLAR ×3 IMPLANT
CEMENT BONE DEPUY (Cement) ×6 IMPLANT
CEMENT RESTRICTOR DEPUY SZ 3 (Cement) ×3 IMPLANT
CLOSURE WOUND 1/2 X4 (GAUZE/BANDAGES/DRESSINGS) ×1
DRAPE INCISE IOBAN 66X45 STRL (DRAPES) ×3 IMPLANT
DRAPE ORTHO SPLIT 77X108 STRL (DRAPES) ×2
DRAPE POUCH INSTRU U-SHP 10X18 (DRAPES) ×3 IMPLANT
DRAPE SURG ORHT 6 SPLT 77X108 (DRAPES) ×1 IMPLANT
DRAPE U-SHAPE 47X51 STRL (DRAPES) ×3 IMPLANT
DRSG EMULSION OIL 3X16 NADH (GAUZE/BANDAGES/DRESSINGS) ×3 IMPLANT
DRSG MEPILEX BORDER 4X4 (GAUZE/BANDAGES/DRESSINGS) ×3 IMPLANT
DRSG MEPILEX BORDER 4X8 (GAUZE/BANDAGES/DRESSINGS) ×3 IMPLANT
DRSG PAD ABDOMINAL 8X10 ST (GAUZE/BANDAGES/DRESSINGS) ×3 IMPLANT
DURAPREP 26ML APPLICATOR (WOUND CARE) ×3 IMPLANT
ELECT REM PT RETURN 9FT ADLT (ELECTROSURGICAL) ×3
ELECTRODE REM PT RTRN 9FT ADLT (ELECTROSURGICAL) ×1 IMPLANT
EVACUATOR 1/8 PVC DRAIN (DRAIN) ×3 IMPLANT
FACESHIELD WRAPAROUND (MASK) ×9 IMPLANT
GAUZE SPONGE 4X4 12PLY STRL (GAUZE/BANDAGES/DRESSINGS) ×6 IMPLANT
GLOVE BIO SURGEON STRL SZ7.5 (GLOVE) ×3 IMPLANT
GLOVE BIO SURGEON STRL SZ8 (GLOVE) ×6 IMPLANT
GLOVE BIOGEL PI IND STRL 8 (GLOVE) ×1 IMPLANT
GLOVE BIOGEL PI INDICATOR 8 (GLOVE) ×2
GOWN STRL REUS W/TWL LRG LVL3 (GOWN DISPOSABLE) ×3 IMPLANT
GOWN STRL REUS W/TWL XL LVL3 (GOWN DISPOSABLE) ×3 IMPLANT
HANDPIECE INTERPULSE COAX TIP (DISPOSABLE) ×2
IMMOBILIZER KNEE 20 (SOFTGOODS)
IMMOBILIZER KNEE 20 THIGH 36 (SOFTGOODS) IMPLANT
KIT BASIN OR (CUSTOM PROCEDURE TRAY) ×3 IMPLANT
MANIFOLD NEPTUNE II (INSTRUMENTS) ×3 IMPLANT
PACK TOTAL JOINT (CUSTOM PROCEDURE TRAY) ×3 IMPLANT
PADDING CAST COTTON 6X4 STRL (CAST SUPPLIES) ×6 IMPLANT
PASSER SUT SWANSON 36MM LOOP (INSTRUMENTS) ×3 IMPLANT
POSITIONER SURGICAL ARM (MISCELLANEOUS) ×3 IMPLANT
PRESSURIZER FEMORAL UNIV (MISCELLANEOUS) ×3 IMPLANT
SET HNDPC FAN SPRY TIP SCT (DISPOSABLE) ×1 IMPLANT
STRIP CLOSURE SKIN 1/2X4 (GAUZE/BANDAGES/DRESSINGS) ×2 IMPLANT
SUT ETHIBOND NAB CT1 #1 30IN (SUTURE) ×6 IMPLANT
SUT MNCRL AB 4-0 PS2 18 (SUTURE) ×3 IMPLANT
SUT VIC AB 1 CT1 27 (SUTURE) ×6
SUT VIC AB 1 CT1 27XBRD ANTBC (SUTURE) ×3 IMPLANT
SUT VIC AB 2-0 CT1 27 (SUTURE) ×6
SUT VIC AB 2-0 CT1 TAPERPNT 27 (SUTURE) ×3 IMPLANT
TOWEL OR 17X26 10 PK STRL BLUE (TOWEL DISPOSABLE) ×6 IMPLANT
TOWER CARTRIDGE SMART MIX (DISPOSABLE) ×3 IMPLANT

## 2013-09-05 NOTE — ED Notes (Signed)
Pt reports unsure of a number to rate pain. Pt reports left hip hurts a lot and left knee hurts a little.

## 2013-09-05 NOTE — ED Notes (Signed)
Bed: WA02 Expected date:  Expected time:  Means of arrival:  Comments: ems- 80 yo F, hip and knee pain

## 2013-09-05 NOTE — Op Note (Signed)
OPERATIVE REPORT   PREOPERATIVE DIAGNOSIS:  Left femoral neck fracture displaced.   POSTOPERATIVE DIAGNOSIS:  Left femoral neck fracture displaced.   PROCEDURE:  Left hip hemiarthroplasty.   SURGEON: Gaynelle Arabian, M.D.   ASSISTANT: Arlee Muslim, PA-C  ANESTHESIA: General  ESTIMATED BLOOD LOSS:100 ml    DRAINS: Hemovac x1.   COMPLICATIONS: None.   CONDITION: Stable to recovery.   BRIEF CLINICAL NOTE: Alexa Middleton is an 80 y.o. female   who had a fall today leading to a displaced   Left femoral neck fracture. She was evaluated in the emergency  department and admitted to the medical service. She was cleared for  surgery and presents for left hip hemiarthroplasty.   PROCEDURE IN DETAIL: After successful administration of general  anesthetic, the patient was placed in the Right lateral decubitus  position with the Left side up and held with the hip positioner.Left lower extremity was isolated from her perineum with plastic drapes and  prepped and draped in the usual sterile fashion. Short posterolateral  incision was made with a 10 blade through the subcutaneous tissue to the  level of fascia lata which was incised in line with the skin incision.  The sciatic nerve was palpated and protected, and short rotators and  capsule were isolated off the femur. There was complete fracture of the  femoral neck. The femoral head was removed. Diameters 42 mm. We then  isolated the femur with retractors and freshened up the femoral neck cut  with an oscillating saw. I used a starter reamer to get into the  femoral canal and then irrigated with saline. A lateralizing reamer was  then placed. We then broached up to a size 3, which had excellent  rotational and torsional stability. Trial neck for the size 3 with a  28 +1.5 head and 42 bipolar was placed. Hip was reduced with excellent  stability. I placed the right leg on top of the left leg, and the length was short. I placed a + 5 head and  the leg lengths  were equal. Hip was dislocated. All trials were removed. We trialed  the cement restrictor, and size 3 was most appropriate. The size 3  cement restrictor was placed at the appropriate depth in the femoral  canal. Canal was then prepared with pulsatile lavage and thoroughly  dried. Cement was mixed and once ready implantation was injected into  the femoral canal and pressurized. The size 3 [place] basic  cemented stem was then cemented into place in about 20 degrees of  anteversion. When the cement was fully hardened, then the permanent 28 + 5  head and 42 bipolar components were placed. Hip was reduced with  same stability parameters. Capsule and short rotators reattached to the  femur through drill holes with Ethibond suture. Fascia lata was closed  over Hemovac drain with running #1 V-Loc suture. Subcu closed with 2-0  Vicryl, and subcuticular running 4-0 Monocryl. The drains were hooked  to suction. Incision was cleaned and dried and Steri-Strips and a bulky  sterile dressing applied. She was then placed into a knee immobilizer,  awakened and transported to recovery in stable condition.   Gaynelle Arabian, M.D.

## 2013-09-05 NOTE — ED Provider Notes (Signed)
Assumed pt pat care from Dr Wyvonnia Dusky. Imaging significant for L subcapital hip fx. Closed injury. No blood thinners. Will consult orthopedics. Will consult medicine for admission. Will place foley and continue to manage pain while in ED. Pt/husband informed of plan.  Discussed with Dr Wynelle Link. May potentially do surgery tonight. NPO.   Discussed with Dr Wynelle Cleveland, medicine. Admission to med/surg.   Virgel Manifold, MD 09/05/13 [phone]

## 2013-09-05 NOTE — Transfer of Care (Signed)
Immediate Anesthesia Transfer of Care Note  Patient: Alexa Middleton  Procedure(s) Performed: Procedure(s): ARTHROPLASTY BIPOLAR HIP (Left)  Patient Location: PACU  Anesthesia Type:General  Level of Consciousness: awake, alert , oriented and patient cooperative  Airway & Oxygen Therapy: Patient Spontanous Breathing and Patient connected to face mask oxygen  Post-op Assessment: Report given to PACU RN, Post -op Vital signs reviewed and stable and Patient moving all extremities X 4  Post vital signs: stable  Complications: No apparent anesthesia complications

## 2013-09-05 NOTE — ED Notes (Signed)
Husband has belongings bag and pt jewelry.

## 2013-09-05 NOTE — ED Notes (Signed)
Pt reports decrease in pain in left hip but severe pain to left knee. Hospitalist at bedside and aware of pt pain status.

## 2013-09-05 NOTE — H&P (Signed)
[facility] History and Physical  Alexa Middleton OMV:[id number] DOB: 1927-04-03 DOA: 09/05/2013   PCP:  Gareth Eagle   Chief Complaint: hip fracture  HPI: Alexa Middleton is a 80 y.o. female with PMH of HTN and HLP who lost her footing and fell today. She subsequently began to have left hip and leg pain and was unable to walk. She was brought to the ER and on imaging is found to have a left hip fracture. Currently her only complaint is pain in the knee.     General: The patient denies anorexia, fever, weight loss Cardiac: Denies chest pain, syncope, palpitations, + h/o pedal edema  Respiratory: Denies cough, shortness of breath, wheezing GI: Denies severe indigestion/heartburn, abdominal pain, nausea, vomiting, diarrhea and constipation GU: Denies hematuria, incontinence, dysuria  Musculoskeletal: has mild arthritis  Skin: Denies suspicious skin lesions Neurologic: Denies focal weakness or numbness, change in vision  Past Medical History  Diagnosis Date  . Cancer     skin cancer to left leg    Past Surgical History  Procedure Laterality Date  . Cholecystectomy    . Abdominal hysterectomy      Social History: non-smoker, drinks a glass of wine a day Lives at home with husband.    No Known Allergies  History reviewed. No pertinent family history.    Prior to Admission medications   Medication Sig Start Date End Date Taking? Authorizing Provider  amLODipine (NORVASC) 10 MG tablet Take 10 mg by mouth daily.   Yes Historical Provider, MD  atorvastatin (LIPITOR) 10 MG tablet Take 10 mg by mouth daily at 6 PM.   Yes Historical Provider, MD  losartan (COZAAR) 100 MG tablet Take 100 mg by mouth daily.   Yes Historical Provider, MD     Physical Exam: Filed Vitals:   09/05/13 1355 09/05/13 1410 09/05/13 1621 09/05/13 1711  BP: 153/46  161/59 131/69  Pulse: 106  96 96  Temp: 98.3 F (36.8 C)   98.1 F (36.7 C)  TempSrc: Oral   Oral  Resp: 20  18 18   SpO2: 89% 98%  100% 99%     General: AAO x 3, no distress HEENT: Normocephalic and Atraumatic, Mucous membranes pink                PERRLA; EOM intact; No scleral icterus,                 Nares: Patent, Oropharynx: Clear, Fair Dentition                 Neck: FROM, no cervical lymphadenopathy, thyromegaly, carotid bruit or JVD;  Breasts: deferred CHEST WALL: No tenderness  CHEST: Normal respiration, clear to auscultation bilaterally  HEART: Regular rate and rhythm; no murmurs rubs or gallops  BACK: No kyphosis or scoliosis; no CVA tenderness  ABDOMEN: Positive Bowel Sounds, soft, non-tender; no masses, no organomegaly Rectal Exam: deferred EXTREMITIES: No cyanosis, clubbing, or edema Genitalia: not examined  SKIN:  no rash or ulceration  CNS: Alert and Oriented x 4, Nonfocal exam, CN 2-12 intact  Labs on Admission:  Basic Metabolic Panel:  Recent Labs Lab 09/05/13 1413  NA 131*  K 4.2  CL 92*  CO2 26  GLUCOSE 191*  BUN 12  CREATININE 0.56  CALCIUM 8.9   Liver Function Tests: No results found for this basename: AST, ALT, ALKPHOS, BILITOT, PROT, ALBUMIN,  in the last 168 hours No results found for this basename: LIPASE, AMYLASE,  in the last 168  hours No results found for this basename: AMMONIA,  in the last 168 hours CBC:  Recent Labs Lab 09/05/13 1413  WBC 10.1  NEUTROABS 8.4*  HGB 13.3  HCT 39.5  MCV 86.8  PLT 308   Cardiac Enzymes: No results found for this basename: CKTOTAL, CKMB, CKMBINDEX, TROPONINI,  in the last 168 hours  BNP (last 3 results) No results found for this basename: PROBNP,  in the last 8760 hours CBG: No results found for this basename: GLUCAP,  in the last 168 hours  Radiological Exams on Admission: Dg Chest 2 View  09/05/2013   CLINICAL DATA:  Preoperative hip surgery  EXAM: CHEST  2 VIEW  COMPARISON:  July 16, 2009  FINDINGS: Underlying emphysematous changes present. There is scarring in both upper lobes and base regions. There is no frank edema or  consolidation. The heart size is within normal limits. Pulmonary vascularity reflects underlying emphysema. No adenopathy. There is atherosclerotic change in the aorta. No bone lesions.  IMPRESSION: Emphysema with areas of scarring bilaterally. No appreciable edema or consolidation.   Electronically Signed   By: Lowella Grip M.D.   On: 09/05/2013 15:53   Dg Elbow Complete Left  09/05/2013   CLINICAL DATA:  Status post fall.  EXAM: LEFT ELBOW - COMPLETE 3+ VIEW  COMPARISON:  None.  FINDINGS: There is no evidence of fracture, dislocation, or joint effusion. There is no evidence of arthropathy or other focal bone abnormality. Soft tissues are unremarkable.  IMPRESSION: Negative exam.   Electronically Signed   By: Inge Rise M.D.   On: 09/05/2013 15:54   Dg Hip Complete Left  09/05/2013   CLINICAL DATA:  Status post fall.  Left hip pain.  EXAM: LEFT HIP - COMPLETE 2+ VIEW  COMPARISON:  None.  FINDINGS: The patient has an acute subcapital fracture of the left hip. No other acute bony or joint abnormality is identified. Lower lumbar degenerative change is noted.  IMPRESSION: Acute subcapital fracture left hip.   Electronically Signed   By: Inge Rise M.D.   On: 09/05/2013 15:59   Dg Knee 1-2 Views Left  09/05/2013   CLINICAL DATA:  Pain.  EXAM: LEFT KNEE - 1-2 VIEW  COMPARISON:  None.  FINDINGS: Soft tissue structures are unremarkable. Mild tricompartment degenerative change. No evidence of fracture or dislocation .  IMPRESSION: Mild tricompartment degenerative change.  No acute abnormality.   Electronically Signed   By: Marcello Moores  Register   On: 09/05/2013 15:54    EKG: Independently reviewed. Sinus tach with inverted T wave in V2.   Assessment/Plan Principal Problem:   Subcapital fracture of left hip - management per orthopedic surgery - low to moderate risk for surgery  Active Problems:   HTN (hypertension) - resume Cozaar and Amlodipine  Mild hyponatremia - will start slow IVF-  follow    Hyperlipidemia - resume statin    Consulted: Ortho consulted by ER  Code Status: Full code Family Communication: none  DVT Prophylaxis:Lovenox  Time spent: >45 min  [place], MD [facility]  If 7PM-7AM, please contact night-coverage [email or web address] 09/05/2013, 5:20 PM

## 2013-09-05 NOTE — Interval H&P Note (Signed)
History and Physical Interval Note:  09/05/2013 9:32 PM  Alexa Middleton  has presented today for surgery, with the diagnosis of LEFT HIP FRACTURE  The various methods of treatment have been discussed with the patient and family. After consideration of risks, benefits and other options for treatment, the patient has consented to  Procedure(s): ARTHROPLASTY BIPOLAR HIP (Left) as a surgical intervention .  The patient's history has been reviewed, patient examined, no change in status, stable for surgery.  I have reviewed the patient's chart and labs.  Questions were answered to the patient's satisfaction.     Gearlean Alf

## 2013-09-05 NOTE — Consult Note (Signed)
Reason for Consult:  Left hip pain Referring Physician: Dr. Gweneth Dimitri LEVONIA WOLFLEY is an 80 y.o. female.  HPI: Patient is an 80 year old female who was in her own yard earlier today looking at some shrubs when she unfortunately lost her balance and fell sustaining injury to her left hip.  She had immediate onset of pain and was unable to stand or get up.  Her husband was home with her and EMS services was contacted.  She was brought to [facility] ED where xrays showed she sustained a displaced left femoral neck fracture.  She was admitted by [facility] and Dr. Wynelle Link was consulted for orthopedic care.  Due to the nature of the fracture, it was recommended that the patient undergo surgery to fix the hip fracture.  She was admitted for pan control and set up for surgery.  Past Medical History  Diagnosis Date  . Cancer     skin cancer to left leg  . Hypertension     Past Surgical History  Procedure Laterality Date  . Cholecystectomy    . Abdominal hysterectomy      History reviewed. No pertinent family history.  Social History:  reports that she quit smoking about 45 years ago. She has never used smokeless tobacco. She reports that she drinks about 4.2 ounces of alcohol per week. She reports that she does not use illicit drugs.  Allergies: No Known Allergies  Medications:  Amlodipine Lipitor Cozaar  Results for orders placed during the hospital encounter of 09/05/13 (from the past 48 hour(s))  CBC WITH DIFFERENTIAL     Status: Abnormal   Collection Time    09/05/13  2:13 PM      Result Value Ref Range   WBC 10.1  4.0 - 10.5 K/uL   RBC 4.55  3.87 - 5.11 MIL/uL   Hemoglobin 13.3  12.0 - 15.0 g/dL   HCT 39.5  36.0 - 46.0 %   MCV 86.8  78.0 - 100.0 fL   MCH 29.2  26.0 - 34.0 pg   MCHC 33.7  30.0 - 36.0 g/dL   RDW 14.6  11.5 - 15.5 %   Platelets 308  150 - 400 K/uL   Neutrophils Relative % 84 (*) 43 - 77 %   Neutro Abs 8.4 (*) 1.7 - 7.7 K/uL   Lymphocytes Relative 8 (*) 12 -  46 %   Lymphs Abs 0.8  0.7 - 4.0 K/uL   Monocytes Relative 6  3 - 12 %   Monocytes Absolute 0.6  0.1 - 1.0 K/uL   Eosinophils Relative 2  0 - 5 %   Eosinophils Absolute 0.2  0.0 - 0.7 K/uL   Basophils Relative 0  0 - 1 %   Basophils Absolute 0.0  0.0 - 0.1 K/uL  BASIC METABOLIC PANEL     Status: Abnormal   Collection Time    09/05/13  2:13 PM      Result Value Ref Range   Sodium 131 (*) 137 - 147 mEq/L   Potassium 4.2  3.7 - 5.3 mEq/L   Chloride 92 (*) 96 - 112 mEq/L   CO2 26  19 - 32 mEq/L   Glucose, Bld 191 (*) 70 - 99 mg/dL   BUN 12  6 - 23 mg/dL   Creatinine, Ser 0.56  0.50 - 1.10 mg/dL   Calcium 8.9  8.4 - 10.5 mg/dL   GFR calc non Af Amer 83 (*) >90 mL/min   GFR calc  Af Amer >90  >90 mL/min   Comment: (NOTE)     The eGFR has been calculated using the CKD EPI equation.     This calculation has not been validated in all clinical situations.     eGFR's persistently <90 mL/min signify possible Chronic Kidney     Disease.   Anion gap 13  5 - 15  URINALYSIS, ROUTINE W REFLEX MICROSCOPIC     Status: None   Collection Time    09/05/13  4:30 PM      Result Value Ref Range   Color, Urine YELLOW  YELLOW   APPearance CLEAR  CLEAR   Specific Gravity, Urine 1.014  1.005 - 1.030   pH 6.0  5.0 - 8.0   Glucose, UA NEGATIVE  NEGATIVE mg/dL   Hgb urine dipstick NEGATIVE  NEGATIVE   Bilirubin Urine NEGATIVE  NEGATIVE   Ketones, ur NEGATIVE  NEGATIVE mg/dL   Protein, ur NEGATIVE  NEGATIVE mg/dL   Urobilinogen, UA 0.2  0.0 - 1.0 mg/dL   Nitrite NEGATIVE  NEGATIVE   Leukocytes, UA NEGATIVE  NEGATIVE   Comment: MICROSCOPIC NOT DONE ON URINES WITH NEGATIVE PROTEIN, BLOOD, LEUKOCYTES, NITRITE, OR GLUCOSE <1000 mg/dL.  SURGICAL PCR SCREEN     Status: None   Collection Time    09/05/13  5:58 PM      Result Value Ref Range   MRSA, PCR NEGATIVE  NEGATIVE   Staphylococcus aureus NEGATIVE  NEGATIVE   Comment:            The Xpert SA Assay (FDA     approved for NASAL specimens     in  patients over 40 years of age),     is one component of     a comprehensive surveillance     program.  Test performance has     been validated by [employer] for patients greater     than or equal to 58 year old.     It is not intended     to diagnose infection nor to     guide or monitor treatment.    Dg Chest 2 View  09/05/2013   CLINICAL DATA:  Preoperative hip surgery  EXAM: CHEST  2 VIEW  COMPARISON:  July 16, 2009  FINDINGS: Underlying emphysematous changes present. There is scarring in both upper lobes and base regions. There is no frank edema or consolidation. The heart size is within normal limits. Pulmonary vascularity reflects underlying emphysema. No adenopathy. There is atherosclerotic change in the aorta. No bone lesions.  IMPRESSION: Emphysema with areas of scarring bilaterally. No appreciable edema or consolidation.   Electronically Signed   By: Lowella Grip M.D.   On: 09/05/2013 15:53   Dg Elbow Complete Left  09/05/2013   CLINICAL DATA:  Status post fall.  EXAM: LEFT ELBOW - COMPLETE 3+ VIEW  COMPARISON:  None.  FINDINGS: There is no evidence of fracture, dislocation, or joint effusion. There is no evidence of arthropathy or other focal bone abnormality. Soft tissues are unremarkable.  IMPRESSION: Negative exam.   Electronically Signed   By: Inge Rise M.D.   On: 09/05/2013 15:54   Dg Hip Complete Left  09/05/2013   CLINICAL DATA:  Status post fall.  Left hip pain.  EXAM: LEFT HIP - COMPLETE 2+ VIEW  COMPARISON:  None.  FINDINGS: The patient has an acute subcapital fracture of the left hip. No other acute bony or joint abnormality is identified.  Lower lumbar degenerative change is noted.  IMPRESSION: Acute subcapital fracture left hip.   Electronically Signed   By: Inge Rise M.D.   On: 09/05/2013 15:59   Dg Knee 1-2 Views Left  09/05/2013   CLINICAL DATA:  Pain.  EXAM: LEFT KNEE - 1-2 VIEW  COMPARISON:  None.  FINDINGS: Soft tissue structures are  unremarkable. Mild tricompartment degenerative change. No evidence of fracture or dislocation .  IMPRESSION: Mild tricompartment degenerative change.  No acute abnormality.   Electronically Signed   By: Marcello Moores  Register   On: 09/05/2013 15:54    ROS Blood pressure 135/46, pulse 82, temperature 98.3 F (36.8 C), temperature source Oral, resp. rate 18, height _0  (1.575 m), weight 53.071 kg (117 lb), SpO2 97.00%. Physical Exam  80 year old female seen in hospital bed at [facility]. She is alert, oriented, and cooperative. No acute distress  Left Leg - NVI to the left lower extremity. Motor - moving foot and toes well. Sensation intact. Left leg is shortened and externally rotated  Radiographs - displaced left subcapital femoral neck fracture  Assessment/Plan: Left Femoral Neck Fracture  Plan - admitted by medicine service. Plan for a left hip hemiarthroplasty as per Dr. Levon Hedger, [place] 09/05/2013, 9:06 PM

## 2013-09-05 NOTE — ED Notes (Signed)
Per EMS pt tripped in yard resulting in left hip and left knee pain. Pt denies LOC, CP, or SOB. Pt not on blood thinners. Pt unable to bear weight to left leg. Moderate pedal pulses bilaterally. No deformity noted.

## 2013-09-05 NOTE — ED Provider Notes (Signed)
CSN: [id number]     Arrival date & time 09/05/13  1353 History   First MD Initiated Contact with Patient 09/05/13 1358     Chief Complaint  Patient presents with  . Knee Pain  . Hip Pain     (Consider location/radiation/quality/duration/timing/severity/associated sxs/prior Treatment) HPI Comments: Patient complains of pain to left knee and left hip after falling in her yard. She states this was a mechanical trip and fall. Denies hitting head or losing consciousness. Is not taking any blood thinners. Is unable to bear weight on her left leg. She denies any neck or back pain. Mild hypoxia at 89% for EMS. Denies any chest pain or shortness of breath. Denies any abdominal pain. Denies any focal weakness, numbness or tingling.  The history is provided by the patient and the EMS personnel.    Past Medical History  Diagnosis Date  . Cancer     skin cancer to left leg   Past Surgical History  Procedure Laterality Date  . Cholecystectomy    . Abdominal hysterectomy     History reviewed. No pertinent family history. History  Substance Use Topics  . Smoking status: Former Smoker    Quit date: 09/05/1968  . Smokeless tobacco: Never Used  . Alcohol Use: 4.2 oz/week    7 Glasses of wine per week     Comment: 1  glass wine daily   OB History   Grav Para Term Preterm Abortions TAB SAB Ect Mult Living                 Review of Systems  Constitutional: Negative for fever, activity change and appetite change.  HENT: Negative for congestion and rhinorrhea.   Eyes: Negative for visual disturbance.  Respiratory: Negative for cough, chest tightness and shortness of breath.   Cardiovascular: Negative for chest pain.  Gastrointestinal: Negative for nausea, vomiting and abdominal pain.  Genitourinary: Negative for dysuria, hematuria, vaginal bleeding and vaginal discharge.  Musculoskeletal: Positive for arthralgias, back pain and myalgias. Negative for neck pain.  Skin: Negative for rash.   Neurological: Negative for dizziness, weakness and headaches.  A complete 10 system review of systems was obtained and all systems are negative except as noted in the HPI and PMH.      Allergies  Review of patient's allergies indicates no known allergies.  Home Medications   Prior to Admission medications   Medication Sig Start Date End Date Taking? Authorizing Provider  amLODipine (NORVASC) 10 MG tablet Take 10 mg by mouth daily.   Yes Historical Provider, MD  atorvastatin (LIPITOR) 10 MG tablet Take 10 mg by mouth daily at 6 PM.   Yes Historical Provider, MD  losartan (COZAAR) 100 MG tablet Take 100 mg by mouth daily.   Yes Historical Provider, MD   BP 161/59  Pulse 96  Temp(Src) 98.3 F (36.8 C) (Oral)  Resp 18  SpO2 100% Physical Exam  Constitutional: She is oriented to person, place, and time. She appears well-developed and well-nourished. No distress.  HENT:  Head: Normocephalic and atraumatic.  Mouth/Throat: Oropharynx is clear and moist. No oropharyngeal exudate.  Eyes: Conjunctivae and EOM are normal. Pupils are equal, round, and reactive to light.  Neck: Normal range of motion. Neck supple.  No C spine pain.  Cardiovascular: Normal rate, regular rhythm and normal heart sounds.   No murmur heard. Pulmonary/Chest: Effort normal and breath sounds normal. No respiratory distress.  Abdominal: Soft. There is no tenderness. There is no rebound and no guarding.  Musculoskeletal: She exhibits tenderness. She exhibits no edema.  Leg is shortened and externally rotated. Pain with any palpation of the hip. Unable to range. Pain to left lateral knee Intact DP and PT pulses No T. or L-spine pain  Skin tear L elbow without bony tenderness  Neurological: She is alert and oriented to person, place, and time. No cranial nerve deficit. She exhibits normal muscle tone. Coordination normal.  Skin: Skin is warm.    ED Course  Procedures (including critical care time) Labs  Review Labs Reviewed  CBC WITH DIFFERENTIAL - Abnormal; Notable for the following:    Neutrophils Relative % 84 (*)    Neutro Abs 8.4 (*)    Lymphocytes Relative 8 (*)    All other components within normal limits  BASIC METABOLIC PANEL - Abnormal; Notable for the following:    Sodium 131 (*)    Chloride 92 (*)    Glucose, Bld 191 (*)    GFR calc non Af Amer 83 (*)    All other components within normal limits  URINALYSIS, ROUTINE W REFLEX MICROSCOPIC    Imaging Review Dg Chest 2 View  09/05/2013   CLINICAL DATA:  Preoperative hip surgery  EXAM: CHEST  2 VIEW  COMPARISON:  July 16, 2009  FINDINGS: Underlying emphysematous changes present. There is scarring in both upper lobes and base regions. There is no frank edema or consolidation. The heart size is within normal limits. Pulmonary vascularity reflects underlying emphysema. No adenopathy. There is atherosclerotic change in the aorta. No bone lesions.  IMPRESSION: Emphysema with areas of scarring bilaterally. No appreciable edema or consolidation.   Electronically Signed   By: Lowella Grip M.D.   On: 09/05/2013 15:53   Dg Elbow Complete Left  09/05/2013   CLINICAL DATA:  Status post fall.  EXAM: LEFT ELBOW - COMPLETE 3+ VIEW  COMPARISON:  None.  FINDINGS: There is no evidence of fracture, dislocation, or joint effusion. There is no evidence of arthropathy or other focal bone abnormality. Soft tissues are unremarkable.  IMPRESSION: Negative exam.   Electronically Signed   By: Inge Rise M.D.   On: 09/05/2013 15:54   Dg Hip Complete Left  09/05/2013   CLINICAL DATA:  Status post fall.  Left hip pain.  EXAM: LEFT HIP - COMPLETE 2+ VIEW  COMPARISON:  None.  FINDINGS: The patient has an acute subcapital fracture of the left hip. No other acute bony or joint abnormality is identified. Lower lumbar degenerative change is noted.  IMPRESSION: Acute subcapital fracture left hip.   Electronically Signed   By: Inge Rise M.D.   On:  09/05/2013 15:59   Dg Knee 1-2 Views Left  09/05/2013   CLINICAL DATA:  Pain.  EXAM: LEFT KNEE - 1-2 VIEW  COMPARISON:  None.  FINDINGS: Soft tissue structures are unremarkable. Mild tricompartment degenerative change. No evidence of fracture or dislocation .  IMPRESSION: Mild tricompartment degenerative change.  No acute abnormality.   Electronically Signed   By: Marcello Moores  Register   On: 09/05/2013 15:54     EKG Interpretation   Date/Time:  Thursday September 05 2013 14:21:43 EDT Ventricular Rate:  106 PR Interval:  211 QRS Duration: 92 QT Interval:  342 QTC Calculation: 454 R Axis:   -15 Text Interpretation:  Sinus tachycardia Borderline prolonged PR interval  Left atrial enlargement Borderline left axis deviation Anteroseptal  infarct, age indeterminate No significant change was found Confirmed by  Wyvonnia Dusky  MD, Yoona Ishii [id number]) on 09/05/2013 2:25:30 PM  MDM   Final diagnoses:  Subcapital fracture of left hip, closed, initial encounter   Mechanical fall with left hip and knee pain. No loss of consciousness.  EKG, labs, Xrays.  CXR given hypoxia. Update tetanus. Smoking history but no diagnosed COPD.  Suspect L hip fracture.  Xrays pending at time of sign out to Dr. Wilson Singer.    Ezequiel Essex, MD 09/05/13 [phone]

## 2013-09-05 NOTE — ED Notes (Signed)
Per MD turn of oxygen to see what pt is on RA. Pt O2 saturation 88% on RA. 2 lpm [place] placed.

## 2013-09-05 NOTE — Anesthesia Postprocedure Evaluation (Signed)
  Anesthesia Post-op Note  Patient: Alexa Middleton  Procedure(s) Performed: Procedure(s) (LRB): ARTHROPLASTY BIPOLAR HIP (Left)  Patient Location: PACU  Anesthesia Type: General  Level of Consciousness: awake and alert   Airway and Oxygen Therapy: Patient Spontanous Breathing  Post-op Pain: mild  Post-op Assessment: Post-op Vital signs reviewed, Patient's Cardiovascular Status Stable, Respiratory Function Stable, Patent Airway and No signs of Nausea or vomiting  Last Vitals:  Filed Vitals:   09/05/13 2330  BP: 129/38  Pulse: 91  Temp:   Resp: 23    Post-op Vital Signs: stable   Complications: No apparent anesthesia complications

## 2013-09-05 NOTE — ED Notes (Signed)
Pt transported to xray 

## 2013-09-05 NOTE — Anesthesia Preprocedure Evaluation (Addendum)
Anesthesia Evaluation  Patient identified by MRN, date of birth, ID band Patient awake    Reviewed: Allergy & Precautions, H&P , NPO status , Patient's Chart, lab work & pertinent test results  Airway Mallampati: II TM Distance: >3 FB Neck ROM: full    Dental no notable dental hx.    Pulmonary COPDformer smoker,  CXR - emphysema breath sounds clear to auscultation  Pulmonary exam normal       Cardiovascular hypertension, Pt. on medications Rhythm:regular Rate:Normal     Neuro/Psych negative neurological ROS  negative psych ROS   GI/Hepatic negative GI ROS, Neg liver ROS,   Endo/Other  negative endocrine ROS  Renal/GU negative Renal ROS  negative genitourinary   Musculoskeletal   Abdominal   Peds  Hematology negative hematology ROS (+)   Anesthesia Other Findings   Reproductive/Obstetrics negative OB ROS                         Anesthesia Physical Anesthesia Plan  ASA: III  Anesthesia Plan: General   Post-op Pain Management:    Induction: Intravenous  Airway Management Planned: Oral ETT  Additional Equipment:   Intra-op Plan:   Post-operative Plan: Extubation in OR  Informed Consent: I have reviewed the patients History and Physical, chart, labs and discussed the procedure including the risks, benefits and alternatives for the proposed anesthesia with the patient or authorized representative who has indicated his/her understanding and acceptance.   Dental Advisory Given  Plan Discussed with: CRNA and Surgeon  Anesthesia Plan Comments:         Anesthesia Quick Evaluation

## 2013-09-06 ENCOUNTER — Encounter (HOSPITAL_COMMUNITY): Payer: Self-pay | Admitting: Orthopedic Surgery

## 2013-09-06 DIAGNOSIS — D72829 Elevated white blood cell count, unspecified: Secondary | ICD-10-CM

## 2013-09-06 DIAGNOSIS — D62 Acute posthemorrhagic anemia: Secondary | ICD-10-CM | POA: Diagnosis present

## 2013-09-06 DIAGNOSIS — S72033A Displaced midcervical fracture of unspecified femur, initial encounter for closed fracture: Principal | ICD-10-CM

## 2013-09-06 DIAGNOSIS — E871 Hypo-osmolality and hyponatremia: Secondary | ICD-10-CM | POA: Diagnosis present

## 2013-09-06 LAB — CBC
HEMATOCRIT: 35.8 % — AB (ref 36.0–46.0)
Hemoglobin: 11.8 g/dL — ABNORMAL LOW (ref 12.0–15.0)
MCH: 29.4 pg (ref 26.0–34.0)
MCHC: 33 g/dL (ref 30.0–36.0)
MCV: 89.1 fL (ref 78.0–100.0)
PLATELETS: 249 10*3/uL (ref 150–400)
RBC: 4.02 MIL/uL (ref 3.87–5.11)
RDW: 14.6 % (ref 11.5–15.5)
WBC: 11.1 10*3/uL — AB (ref 4.0–10.5)

## 2013-09-06 LAB — BASIC METABOLIC PANEL
ANION GAP: 9 (ref 5–15)
BUN: 9 mg/dL (ref 6–23)
CHLORIDE: 96 meq/L (ref 96–112)
CO2: 26 mEq/L (ref 19–32)
CREATININE: 0.51 mg/dL (ref 0.50–1.10)
Calcium: 7.9 mg/dL — ABNORMAL LOW (ref 8.4–10.5)
GFR calc Af Amer: >90 mL/min (ref >90)
GFR calc non Af Amer: 86 mL/min — ABNORMAL LOW (ref >90)
Glucose, Bld: 156 mg/dL — ABNORMAL HIGH (ref 70–99)
Potassium: 5.4 mEq/L — ABNORMAL HIGH (ref 3.7–5.3)
Sodium: 131 mEq/L — ABNORMAL LOW (ref 137–147)

## 2013-09-06 MED ORDER — TRAMADOL HCL 50 MG PO TABS: 50.0000 mg | ORAL_TABLET | Freq: Four times a day (QID) | ORAL | Status: DC | PRN

## 2013-09-06 MED ORDER — ONDANSETRON HCL 4 MG PO TABS: 4.0000 mg | ORAL_TABLET | Freq: Four times a day (QID) | ORAL | Status: DC | PRN

## 2013-09-06 MED ORDER — METHOCARBAMOL 500 MG PO TABS: 500.0000 mg | ORAL_TABLET | Freq: Four times a day (QID) | ORAL | Status: DC | PRN

## 2013-09-06 MED ORDER — ENOXAPARIN SODIUM 40 MG/0.4ML ~~LOC~~ SOLN
40.0000 mg | SUBCUTANEOUS | Status: DC
  Administered 2013-09-06 – 2013-09-08 (×3): 40 mg via SUBCUTANEOUS
  Filled 2013-09-06 (×3): qty 0.4

## 2013-09-06 MED ORDER — MORPHINE SULFATE 2 MG/ML IJ SOLN: 2.0000 mg | INTRAMUSCULAR | Status: DC | PRN

## 2013-09-06 MED ORDER — METOCLOPRAMIDE HCL 5 MG/ML IJ SOLN: 5.0000 mg | Freq: Three times a day (TID) | INTRAMUSCULAR | Status: DC | PRN

## 2013-09-06 MED ORDER — BUPIVACAINE LIPOSOME 1.3 % IJ SUSP: 20.0000 mL | Freq: Once | INTRAMUSCULAR | Status: DC

## 2013-09-06 MED ORDER — MENTHOL 3 MG MT LOZG: 1.0000 | LOZENGE | OROMUCOSAL | Status: DC | PRN

## 2013-09-06 MED ORDER — DEXAMETHASONE SODIUM PHOSPHATE 10 MG/ML IJ SOLN: 10.0000 mg | Freq: Once | INTRAMUSCULAR | Status: DC

## 2013-09-06 MED ORDER — DEXTROSE 5 % IV SOLN
500.0000 mg | Freq: Four times a day (QID) | INTRAVENOUS | Status: DC | PRN
  Filled 2013-09-06: qty 5

## 2013-09-06 MED ORDER — FLEET ENEMA 7-19 GM/118ML RE ENEM: 1.0000 | ENEMA | Freq: Once | RECTAL | Status: AC | PRN

## 2013-09-06 MED ORDER — CEFAZOLIN SODIUM-DEXTROSE 2-3 GM-% IV SOLR
2.0000 g | Freq: Four times a day (QID) | INTRAVENOUS | Status: AC
  Administered 2013-09-06 (×2): 2 g via INTRAVENOUS
  Filled 2013-09-06 (×2): qty 50

## 2013-09-06 MED ORDER — SODIUM CHLORIDE 0.9 % IV SOLN
INTRAVENOUS | Status: DC
  Administered 2013-09-06 (×2): via INTRAVENOUS

## 2013-09-06 MED ORDER — ACETAMINOPHEN 10 MG/ML IV SOLN: 1000.0000 mg | Freq: Once | INTRAVENOUS | Status: DC

## 2013-09-06 MED ORDER — METOCLOPRAMIDE HCL 10 MG PO TABS: 5.0000 mg | ORAL_TABLET | Freq: Three times a day (TID) | ORAL | Status: DC | PRN

## 2013-09-06 MED ORDER — DOCUSATE SODIUM 100 MG PO CAPS
100.0000 mg | ORAL_CAPSULE | Freq: Two times a day (BID) | ORAL | Status: DC
  Administered 2013-09-06 – 2013-09-12 (×13): 100 mg via ORAL
  Filled 2013-09-06 (×12): qty 1

## 2013-09-06 MED ORDER — ACETAMINOPHEN 325 MG PO TABS
650.0000 mg | ORAL_TABLET | Freq: Four times a day (QID) | ORAL | Status: DC | PRN
Start: 2013-09-06 — End: 2013-09-13
  Administered 2013-09-06: 650 mg via ORAL
  Administered 2013-09-07 – 2013-09-08 (×2): 325 mg via ORAL
  Filled 2013-09-06 (×3): qty 2

## 2013-09-06 MED ORDER — ONDANSETRON HCL 4 MG/2ML IJ SOLN: 4.0000 mg | Freq: Four times a day (QID) | INTRAMUSCULAR | Status: DC | PRN

## 2013-09-06 MED ORDER — BISACODYL 10 MG RE SUPP
10.0000 mg | Freq: Every day | RECTAL | Status: DC | PRN
  Administered 2013-09-06: 10 mg via RECTAL
  Filled 2013-09-06: qty 1

## 2013-09-06 MED ORDER — HYDROCODONE-ACETAMINOPHEN 5-325 MG PO TABS
1.0000 | ORAL_TABLET | ORAL | Status: DC | PRN
  Administered 2013-09-07 – 2013-09-08 (×2): 1 via ORAL
  Administered 2013-09-10 (×2): 2 via ORAL
  Filled 2013-09-06: qty 2
  Filled 2013-09-06: qty 1
  Filled 2013-09-06: qty 2
  Filled 2013-09-06 (×2): qty 1

## 2013-09-06 MED ORDER — POLYETHYLENE GLYCOL 3350 17 G PO PACK
17.0000 g | PACK | Freq: Every day | ORAL | Status: DC | PRN
  Administered 2013-09-06: 17 g via ORAL
  Filled 2013-09-06: qty 1

## 2013-09-06 MED ORDER — PHENOL 1.4 % MT LIQD: 1.0000 | OROMUCOSAL | Status: DC | PRN

## 2013-09-06 MED ORDER — ACETAMINOPHEN 650 MG RE SUPP: 650.0000 mg | Freq: Four times a day (QID) | RECTAL | Status: DC | PRN

## 2013-09-06 MED ORDER — SENNA 8.6 MG PO TABS
2.0000 | ORAL_TABLET | Freq: Every day | ORAL | Status: DC
  Administered 2013-09-06 – 2013-09-12 (×5): 17.2 mg via ORAL
  Filled 2013-09-06 (×4): qty 2

## 2013-09-06 NOTE — Progress Notes (Signed)
   Subjective: 1 Day Post-Op Procedure(s) (LRB): ARTHROPLASTY BIPOLAR HIP (Left) Patient reports pain as mild.   Patient seen in rounds with Dr. Wynelle Link. Patient is well, but has had some minor complaints of pain in the hip, requiring pain medications We will start therapy today.  Plan is to go Home after hospital stay. Hopefully will be ready to go home by Monday  Objective: Vital signs in last 24 hours: Temp:  [97.6 F (36.4 C)-98.6 F (37 C)] 98.6 F (37 C) (08/28 0527) Pulse Rate:  [60-106] 83 (08/28 0527) Resp:  [16-23] 16 (08/28 0309) BP: (112-161)/(38-69) 143/60 mmHg (08/28 0527) SpO2:  [89 %-100 %] 100 % (08/28 0309) Weight:  [53.071 kg (117 lb)] 53.071 kg (117 lb) (08/27 1711)  Intake/Output from previous day:  Intake/Output Summary (Last 24 hours) at 09/06/13 0936 Last data filed at 09/06/13 0600  Gross per 24 hour  Intake 2819.58 ml  Output   1185 ml  Net 1634.58 ml    Intake/Output this shift:    Labs:  Recent Labs  09/05/13 1413 09/06/13 0457  HGB 13.3 11.8*    Recent Labs  09/05/13 1413 09/06/13 0457  WBC 10.1 11.1*  RBC 4.55 4.02  HCT 39.5 35.8*  PLT 308 249    Recent Labs  09/05/13 1413 09/06/13 0457  NA 131* 131*  K 4.2 5.4*  CL 92* 96  CO2 26 26  BUN 12 9  CREATININE 0.56 0.51  GLUCOSE 191* 156*  CALCIUM 8.9 7.9*   No results found for this basename: LABPT, INR,  in the last 72 hours  EXAM General - Patient is Alert and Appropriate Extremity - Neurovascular intact Sensation intact distally Dressing - dressing C/D/I Motor Function - intact, moving foot and toes well on exam.  Hemovac pulled without difficulty.  Past Medical History  Diagnosis Date  . Cancer     skin cancer to left leg  . Hypertension     Assessment/Plan: 1 Day Post-Op Procedure(s) (LRB): ARTHROPLASTY BIPOLAR HIP (Left) Principal Problem:   Subcapital fracture of left hip Active Problems:   Hip fracture   HTN (hypertension)   Hyperlipidemia  Hyponatremia   Leukocytosis, unspecified   Postoperative anemia due to acute blood loss  Estimated body mass index is 21.39 kg/(m^2) as calculated from the following:   Height as of this encounter: 5\' 2"  (1.575 m).   Weight as of this encounter: 53.071 kg (117 lb). Up with therapy Discharge home with home health probably first of the week.  DVT Prophylaxis - Lovenox Weight Bearing As Tolerated left Leg D/C Knee Immobilizer Hemovac Pulled Begin Therapy Hip Preacutions  Arlee Muslim, PA-C Orthopaedic Surgery 09/06/2013, 9:36 AM

## 2013-09-06 NOTE — Progress Notes (Signed)
[facility] PROGRESS NOTE  Alexa Middleton QIW:[id number] DOB: 1927-11-05 DOA: 09/05/2013 PCP: No primary provider on file.  Assessment/Plan  Subcapital fracture of left hip s/p left hip hemiarthoplasty on 8/27 by Dr. Wynelle Link -  WB and DVT proph per ortho  -  Start scheduled colace/senna -  Continue prn stool softeners and laxatives -  Clarified pain medications for mild, moderate, severe, and breakthrough pain  -  IS -  PT/OT  HTN (hypertension), low diastolic and intermittently elevated systolic -  Continue losartan and Amlodipine   Hyperlipidemia, stable, continue statin  Mild hyponatremia, stable, asymptomatic and suspect some hemolysis on this AM's labs -  Diet now advanced -  D/c IVF once tolerating diet  Mild hyperkalemia, suspect hemolysis -  Repeat in AM  Mild leukocytosis, likely reactive from surgery -  Trend WBC  Acute blood loss anemia due to surgery -  Mild and asymptomatic -  Repeat in AM  Hyperglycemia, likely stress-related from surgery -  Check A1c  Diet:  regular Access:  PIV IVF:  yes Proph:  lovenox  Code Status: full Family Communication: patient alone Disposition Plan:  Likely to rehab in a few days   Consultants:  Orthopedics, Dr. Wynelle Link  Procedures:  Left hip hemiarthroplasty on 8/27   CXR   XR knee  XR left elbow  XR left hip  Antibiotics:  Cefazolin perioperatively    HPI/Subjective:  Denies pain, SOB, chest pain, nausea, vomiting.  No BM since admission.  Catheter still in place  Objective: Filed Vitals:   09/06/13 0111 09/06/13 0220 09/06/13 0309 09/06/13 0527  BP: 135/51 112/50 117/50 143/60  Pulse: 72 64 60 83  Temp: 97.7 F (36.5 C) 97.7 F (36.5 C) 98.2 F (36.8 C) 98.6 F (37 C)  TempSrc: Oral Oral Oral Oral  Resp: 17 16 16    Height:      Weight:      SpO2: 99% 100% 100%     Intake/Output Summary (Last 24 hours) at 09/06/13 0831 Last data filed at 09/06/13 0600  Gross per 24 hour  Intake  2819.58 ml  Output   1185 ml  Net 1634.58 ml   Filed Weights   09/05/13 1711  Weight: 53.071 kg (117 lb)    Exam:   General:  WF, No acute distress  HEENT:  NCAT, MMM  Cardiovascular:  RRR, nl S1, S2 no mrg, 2+ pulses, warm extremities  Respiratory:  CTAB, no increased WOB  Abdomen:   NABS, soft, NT/ND  MSK:   Normal tone and bulk, no LEE.  Left hip dressing c/d/i, no swelling or ecchymosis  Neuro:  Grossly intact  Data Reviewed: Basic Metabolic Panel:  Recent Labs Lab 09/05/13 1413 09/06/13 0457  NA 131* 131*  K 4.2 5.4*  CL 92* 96  CO2 26 26  GLUCOSE 191* 156*  BUN 12 9  CREATININE 0.56 0.51  CALCIUM 8.9 7.9*   Liver Function Tests: No results found for this basename: AST, ALT, ALKPHOS, BILITOT, PROT, ALBUMIN,  in the last 168 hours No results found for this basename: LIPASE, AMYLASE,  in the last 168 hours No results found for this basename: AMMONIA,  in the last 168 hours CBC:  Recent Labs Lab 09/05/13 1413 09/06/13 0457  WBC 10.1 11.1*  NEUTROABS 8.4*  --   HGB 13.3 11.8*  HCT 39.5 35.8*  MCV 86.8 89.1  PLT 308 249   Cardiac Enzymes: No results found for this basename: CKTOTAL, CKMB, CKMBINDEX, TROPONINI,  in  the last 168 hours BNP (last 3 results) No results found for this basename: PROBNP,  in the last 8760 hours CBG: No results found for this basename: GLUCAP,  in the last 168 hours  Recent Results (from the past 240 hour(s))  SURGICAL PCR SCREEN     Status: None   Collection Time    09/05/13  5:58 PM      Result Value Ref Range Status   MRSA, PCR NEGATIVE  NEGATIVE Final   Staphylococcus aureus NEGATIVE  NEGATIVE Final   Comment:            The Xpert SA Assay (FDA     approved for NASAL specimens     in patients over 41 years of age),     is one component of     a comprehensive surveillance     program.  Test performance has     been validated by [employer] for patients greater     than or equal to 23 year old.     It is  not intended     to diagnose infection nor to     guide or monitor treatment.     Studies: Dg Chest 2 View  09/05/2013   CLINICAL DATA:  Preoperative hip surgery  EXAM: CHEST  2 VIEW  COMPARISON:  July 16, 2009  FINDINGS: Underlying emphysematous changes present. There is scarring in both upper lobes and base regions. There is no frank edema or consolidation. The heart size is within normal limits. Pulmonary vascularity reflects underlying emphysema. No adenopathy. There is atherosclerotic change in the aorta. No bone lesions.  IMPRESSION: Emphysema with areas of scarring bilaterally. No appreciable edema or consolidation.   Electronically Signed   By: Lowella Grip M.D.   On: 09/05/2013 15:53   Dg Elbow Complete Left  09/05/2013   CLINICAL DATA:  Status post fall.  EXAM: LEFT ELBOW - COMPLETE 3+ VIEW  COMPARISON:  None.  FINDINGS: There is no evidence of fracture, dislocation, or joint effusion. There is no evidence of arthropathy or other focal bone abnormality. Soft tissues are unremarkable.  IMPRESSION: Negative exam.   Electronically Signed   By: Inge Rise M.D.   On: 09/05/2013 15:54   Dg Hip Complete Left  09/05/2013   CLINICAL DATA:  Status post fall.  Left hip pain.  EXAM: LEFT HIP - COMPLETE 2+ VIEW  COMPARISON:  None.  FINDINGS: The patient has an acute subcapital fracture of the left hip. No other acute bony or joint abnormality is identified. Lower lumbar degenerative change is noted.  IMPRESSION: Acute subcapital fracture left hip.   Electronically Signed   By: Inge Rise M.D.   On: 09/05/2013 15:59   Dg Knee 1-2 Views Left  09/05/2013   CLINICAL DATA:  Pain.  EXAM: LEFT KNEE - 1-2 VIEW  COMPARISON:  None.  FINDINGS: Soft tissue structures are unremarkable. Mild tricompartment degenerative change. No evidence of fracture or dislocation .  IMPRESSION: Mild tricompartment degenerative change.  No acute abnormality.   Electronically Signed   By: Marcello Moores  Register   On:  09/05/2013 15:54   Dg Pelvis Portable  09/06/2013   CLINICAL DATA:  Postoperative  EXAM: PORTABLE PELVIS 1-2 VIEWS  COMPARISON:  Abdomen for 01/2005  FINDINGS: Interval left hip arthroplasty with cemented femoral component. Component appears well seated. Pelvis appears intact. No displaced fractures identified. Soft tissue gas consistent with recent surgery.  IMPRESSION: Left hip arthroplasty with component  appearing well-seated.   Electronically Signed   By: Lucienne Capers M.D.   On: 09/06/2013 00:57    Scheduled Meds: . amLODipine  10 mg Oral Daily  . atorvastatin  10 mg Oral QHS  .  ceFAZolin (ANCEF) IV  2 g Intravenous Q6H  . docusate sodium  100 mg Oral BID  . enoxaparin (LOVENOX) injection  40 mg Subcutaneous Q24H  . losartan  100 mg Oral Daily  . senna  2 tablet Oral QHS  . sodium chloride  3 mL Intravenous Q12H   Continuous Infusions: . sodium chloride 75 mL/hr at 09/06/13 0227    Principal Problem:   Subcapital fracture of left hip Active Problems:   Hip fracture   HTN (hypertension)   Hyperlipidemia    Time spent: 30 min    Jearld Hemp, [facility] Pager [phone]. If 7PM-7AM, please contact night-coverage at [email or web address], password [facility] 09/06/2013, 8:31 AM  LOS: 1 day

## 2013-09-06 NOTE — Progress Notes (Signed)
Will leave Foley in as pt did not return from surgery until after MN. Denied need for pain med throughout night and I anticipate slow progress with PT today.

## 2013-09-06 NOTE — Evaluation (Signed)
Physical Therapy Evaluation Patient Details Name: Alexa Middleton MRN: [id number] DOB: 06/29/1927 Today's Date: 09/06/2013   History of Present Illness  80 yo female adm after fall resulting in L hip fx, s/p L hip hemiarthroplasty; PMHx: HTN  Clinical Impression  Pt will benefit from PT to address deficits below; Pt would likely benefit from SNF but at this time pt is planning for home and does not desire to go to rehab post acute; Per her report she has only her husband for support at home; Pt did well today--min to mod assist for mobility, will see how she progresses over the weekend.  Sats 88-89% on RA prior to mobility and 83% after amb on RA, O2 replaced at 2L; RN present/aware    Follow Up Recommendations Home health PT;Supervision/Assistance - 24 hour    Equipment Recommendations  Rolling walker with 5" wheels    Recommendations for Other Services       Precautions / Restrictions Precautions Precautions: Fall;Posterior Hip Restrictions Weight Bearing Restrictions: No LLE Weight Bearing: Weight bearing as tolerated      Mobility  Bed Mobility Overal bed mobility: Needs Assistance Bed Mobility: Supine to Sit     Supine to sit: Mod assist     General bed mobility comments: pt required assist with lateral scooting while in supine and cues for hip precautions throughout, able to bring trunk to upright with incr time  Transfers Overall transfer level: Needs assistance Equipment used: Rolling walker (2 wheeled) Transfers: Sit to/from Stand Sit to Stand: Mod assist         General transfer comment: cues for hand placement and LLE position/hip precautions  Ambulation/Gait Ambulation/Gait assistance: Min assist Ambulation Distance (Feet): 15 Feet Assistive device: Rolling walker (2 wheeled) Gait Pattern/deviations: Step-to pattern;Trunk flexed     General Gait Details: multi-modal cues for sequence,  RW position, safety  Stairs            Wheelchair  Mobility    Modified Rankin (Stroke Patients Only)       Balance Overall balance assessment: History of Falls;Needs assistance Sitting-balance support: Feet supported;No upper extremity supported Sitting balance-Leahy Scale: Good     Standing balance support: Bilateral upper extremity supported Standing balance-Leahy Scale: Poor                               Pertinent Vitals/Pain Pain Assessment: No/denies pain    Home Living Family/patient expects to be discharged to:: Private residence Living Arrangements: Spouse/significant other   Type of Home: House Home Access: Stairs to enter   Technical brewer of Steps: 3 Home Layout: One level Home Equipment: None      Prior Function Level of Independence: Independent         Comments: pt still drives      Hand Dominance        Extremity/Trunk Assessment               Lower Extremity Assessment: LLE deficits/detail   LLE Deficits / Details: ankle WFL, knee AAROM WFL, hip 2+/5 grossly, limited due to pain with movement     Communication   Communication: No difficulties  Cognition Arousal/Alertness: Awake/alert Behavior During Therapy: WFL for tasks assessed/performed Overall Cognitive Status: Within Functional Limits for tasks assessed                      General Comments      Exercises  Assessment/Plan    PT Assessment Patient needs continued PT services  PT Diagnosis Difficulty walking;Generalized weakness   PT Problem List Decreased strength;Decreased range of motion;Decreased activity tolerance;Decreased balance;Decreased mobility;Decreased knowledge of use of DME;Decreased knowledge of precautions;Decreased safety awareness  PT Treatment Interventions DME instruction;Gait training;Therapeutic activities;Therapeutic exercise;Functional mobility training;Patient/family education;Stair training   PT Goals (Current goals can be found in the Care Plan section)  Acute Rehab PT Goals Patient Stated Goal: to go home PT Goal Formulation: With patient Time For Goal Achievement: 09/12/13 Potential to Achieve Goals: Good    Frequency 7X/week (pt wants to go home)   Barriers to discharge        Co-evaluation               End of Session Equipment Utilized During Treatment: Gait belt Activity Tolerance: Patient tolerated treatment well Patient left: in chair;with call bell/phone within reach Nurse Communication: Mobility status         Time: 2563-8937 PT Time Calculation (min): 28 min   Charges:   PT Evaluation $Initial PT Evaluation Tier I: 1 Procedure PT Treatments $Gait Training: 8-22 mins $Therapeutic Activity: 8-22 mins   PT G Codes:          Kerilyn Cortner 10-03-2013, 10:37 AM

## 2013-09-06 NOTE — Discharge Instructions (Addendum)
Hip Fracture, Open Reduction and Internal Fixation (ORIF) A hip fracture, or broken hip, can happen to anyone. To fix it, surgery is usually needed. One method is called open reduction and internal fixation, or ORIF for short. "Open reduction" means an incision (cut) is made to open the fracture area. This lets the surgeon see the broken bone. The bone pieces will be put back together. Some type of hardware will be used to hold the bones in place. That is called "internal fixation." Screws, pins, rods or a metal plate might be used. More than 250,000 people in the [place] break a hip every year. Nearly all of them are treated successfully with surgery. LET YOUR CAREGIVER KNOW ABOUT : On the day of your surgery, your caregivers will need to know the last time you had anything to eat or drink. This includes water, gum and candy. Also make sure they know about:   Any allergies.  All medications you are taking, including:  Herbs, eyedrops, over-the-counter medications and creams.  Blood thinners (anticoagulants), aspirin or other drugs that could affect blood clotting.  Use of steroids (by mouth or as creams).  Previous problems with anesthesia, including local anesthetics.  Possibility of pregnancy, if this applies.  Any history of blood clots.  Any history of bleeding or other blood problems.  Previous surgery.  Family history of anesthetic complications  Smoking history.  Any recent symptoms of colds or infections.  Other health problems. RISKS AND COMPLICATIONS  All operations have some risk. Being unhealthy increases risks. That is why you want to be as healthy as possible before this surgery. Possible problems after ORIF may include:  Blood clots.  Bleeding.  Infection near the incision.  Lung infection (pneumonia).  Pain that continues after the operation.  Trouble walking. Some people may need to continue using a walker. BEFORE THE PROCEDURE You should be as  healthy as possible before surgery for a broken hip. Sometimes this means waiting until other health problems are addressed. Then, the operation can be scheduled. To find out if you are ready for surgery:  A medical evaluation will be done. This examination will include checking your heart and lungs.  Imaging tests. These let the surgeon see what the fracture looks like. They could include:  X-rays to find exactly where the break is.  Computed tomography (CT) scan. A CT scan takes pictures using X-rays and a computer. This can give a better view of the broken hip.  Magnetic resonance imaging (MRI scan). It uses a magnet, radio waves and a computer. It may show a hidden fracture that cannot be seen on X-ray or CT.  Blood tests.  Urine test. It is possible to have a urinary tract infection and not know it.  Talking with an anesthesiologist. This is the person who will be in charge of the anesthesia (medication to stop the pain) during the surgery. An ORIF procedure usually is done with general anesthesia (being asleep during surgery), or a spinal anesthesia is used to make you numb (no feeling) from the waist down but awake during the operation. Ask your surgeon if there is an advantage to one type of anesthetic over the other. You will need to stop taking certain medicines.  The admitting physician will have you stop using aspirin and non-steroidal anti-inflammatory drugs (NSAIDs) for pain relief. This includes prescription drugs and over-the-counter drugs such as ibuprofen and naproxen.  If you take blood-thinners, ask your healthcare provider when you should stop taking  them. You will have to give what is called informed consent. This requires signing a legal paper that gives permission for the surgery. To give informed consent:  You must understand how the procedure is done and why.  You must be told all the risks and benefits of the procedure.  You must sign the consent. Or, a legal  guardian can do this.  Signing should be witnessed by a healthcare professional. The day before the surgery, eat only a light dinner. Then, do not eat or drink anything for at least 8 hours before the surgery. Ask if it is OK to take any needed medicines with a sip of water. PROCEDURE The preparation:  Small monitors will be put on your body. They are used to check your heart, blood pressure and oxygen level.  You will be given an intravenous line (IV). A needle will be inserted in your arm. It is hooked to a plastic tube. Medication will be able to flow directly into your body through the IV.  You will be given anesthesia.  For general anesthesia, the anesthesiologist may hold a mask gently over your face. You will breathe in gases that will make you sleep. A tube also might be put in your throat. This would let you continue to get anesthesia during the procedure.  For spinal anesthesia, a drug will be injected (shot) into the spinal cord area. This will make the body numb from the waist down.  The hip area will be scrubbed with a special solution to kill any germs.  The procedure:  Once you are asleep or numb, the surgeon will move the bones (realign the fracture) before any incisions are made. The goal is to get the bones back to their normal position.  X-rays may be taken. This is to check the position of the bones.  An incision is made over the hip. It will go through the muscles to the broken bone.  The bones will be put in place. Some type of hardware will be used to hold the bone together.  The hip is a ball-and-socket joint. The "ball" part of the joint is the very top of the upper leg bone (femur). Sometimes the very top of the upper leg bone is replaced with a man-made piece. If it is replaced, this is called a partial hip replacement. Sometimes a complete or total hip replacement will be preformed, replacing both the ball and the socket. This is the preferred treatment if  there is any appearance of arthritis in the hip joint.  The incision is closed with small stitches or staples.  A dressing (medicine and a bandage) is put over the incision.  An ORIF procedure can take several hours. AFTER THE PROCEDURE  You will stay in a recovery area until the anesthesia has worn off. Your blood pressure and pulse will be checked every so often. Then you will be taken to a hospital room.  You may continue to get fluids through the IV for awhile.  Some pain is normal after an ORIF procedure. You will probably be given pain medicine. Be sure to tell your caregivers if the pain becomes severe.  It is important to be up and moving as soon as possible after an operation. Physical therapists will help you start walking. You will probably need to use a walker for a while. Follow the therapists instructions regarding weight bearing on the injured leg.  To prevent blood clots in your legs:  You may be given  special stockings to wear.  You may need to take medicine to prevent clots.  Most people stay in the hospital for several days after this surgery.  Physical therapy is usually needed. Some people go to a rehabilitation center (a long-term care center or transitional care unit) before going home. Ask your healthcare providers what would be best for you. Often social workers are available to help you and your family make the best decision for you. HOME CARE INSTRUCTIONS   Medication.  Take any pain medicine that your surgeon suggests. Follow the directions carefully. Do not take over-the-counter painkillers unless the surgeon says it is OK. Medicine such as aspirin or ibuprofen can increase the chances of bleeding.  Your healthcare provider may prescribe a blood-thinner for several weeks to 2 months. These drugs prevent blood clots.  Wound care.  Check the area around the incision carefully each day. Look for any redness or swelling. Also check for any fluid that is  seeping from the incision. Tell your healthcare provider if you see anything.  Do not get the incision wet until your surgeon says it is OK.  Activity.  Most people will need the help of a walker or crutches for some time.  You will need to continue physical therapy once you are home. This often lasts for several months.  You will learn how to avoid putting stress on your hip while it heals if that is the direction given by your surgeon.  Be sure to do any exercises the therapist suggests. These exercises will help make your hip stronger.  Special equipment might make life at home easier. One example is a seat for the shower. Another is a raised toilet seat.  Ask your healthcare provider when you can resume other activities, such as work, driving or sex.  Follow-up care.  The surgeon may need to take out stitches or staples. This is usually done about two weeks after the operation.  The surgeon will do X-rays to check how your hip is healing. SEEK MEDICAL CARE IF:   You have any questions about medications.  You feel weak.  You are too tired to walk every day.  Pain continues, even after taking pain medicine.  You develop a fever of more than 100.5 F (38.1 C). SEEK IMMEDIATE MEDICAL CARE IF:   The incision becomes red or swollen. Or, it bleeds.  Your leg or foot becomes painful and swollen.  Your leg becomes pale or blue. It feels cold. It tingles or is numb.  You have trouble breathing.  You have chest pain.  You develop a fever of more than 102 F (38.9 C). Document Released: 12/15/2008 Document Revised: 03/21/2011 Document Reviewed: 12/15/2008 [facility] Patient Information 2015 [place], [place]. This information is not intended to replace advice given to you by your health care provider. Make sure you discuss any questions you have with your health care provider.  When discharged from the skilled rehab facility, please have the facility set up the patient's [place] prior to being released.  Also provide the patient with their medications at time of release from the facility to include their pain medication, the muscle relaxants, and their blood thinner medication.  If the patient is still at the rehab facility at time of follow up appointment, please also assist the patient in arranging follow up appointment in our office and any transportation needs.

## 2013-09-06 NOTE — Progress Notes (Signed)
CARE MANAGEMENT NOTE 09/06/2013  Patient:  Alexa Middleton, Alexa Middleton   Account Number:  [id number]  Date Initiated:  09/06/2013  Documentation initiated by:  DAVIS,RHONDA  Subjective/Objective Assessment:   patient sustained fall at home and resulted in fracture of the left hip requiring surgical intervention.     Action/Plan:   home with hhc and family support/patient lives at home with spouse and has been indep. in adls and activites   Anticipated DC Date:  09/09/2013   Anticipated DC Plan:  [place]  In-house referral  NA      DC Planning Services  CM consult      PAC Choice  NA   Choice offered to / List presented to:  C-1 Patient   DME arranged  [place]      DME agency  [place] arranged  [place]   Status of service:  In process, will continue to follow Medicare Important Message given?  NA - LOS <3 / Initial given by admissions (If response is "NO", the following Medicare IM given date fields will be blank) Date Medicare IM given:   Medicare IM given by:   Date Additional Medicare IM given:   Additional Medicare IM given by:    Discharge Disposition:    Per UR Regulation:  Reviewed for med. necessity/level of care/duration of stay  If discussed at [place] of Stay Meetings, dates discussed:    Comments:  Suanne Marker Davis,RN,BSN,CCM

## 2013-09-06 NOTE — Evaluation (Signed)
Occupational Therapy Evaluation Patient Details Name: Alexa Middleton MRN: [id number] DOB: June 25, 1927 Today's Date: 09/06/2013    History of Present Illness 80 yo female adm after fall resulting in L hip fx, s/p L hip hemiarthroplasty; PMHx: HTN   Clinical Impression   Pt was admitted for the above surgery.  She was independent prior to admission and plans to have her husband assist with ADLs.  Pt does need reinforcement with posterior THPs during toilet transfers and needs further education for shower transfers.  Will follow in acute focusing on these areas.  Pt did have difficulty following cues for directionality/UE and LE placement:  May be due to medications.    Follow Up Recommendations  Home health OT (as long as pt continues to progress to where husband can safely assist her)    Equipment Recommendations  3 in 1 bedside comode (delivered)    Recommendations for Other Services       Precautions / Restrictions Precautions Precautions: Fall;Posterior Hip Restrictions LLE Weight Bearing: Weight bearing as tolerated      Mobility Bed Mobility                  Transfers     Transfers: Sit to/from Stand Sit to Stand: Mod assist         General transfer comment: cues for UE/LE placement and THPs    Balance           Standing balance support: Bilateral upper extremity supported Standing balance-Leahy Scale: Poor                              ADL Overall ADL's : Needs assistance/impaired Eating/Feeding: Independent;Sitting   Grooming: Set up;Sitting;Supervision/safety   Upper Body Bathing: Set up;Sitting;Supervision/ safety   Lower Body Bathing: Maximal assistance;Sit to/from stand   Upper Body Dressing : Supervision/safety;Set up;Sitting   Lower Body Dressing: Total assistance;Sit to/from stand   Toilet Transfer: +2 for safety/equipment;Moderate assistance;BSC;Stand-pivot   Toileting- Clothing Manipulation and Hygiene: Total  assistance;Sit to/from stand         General ADL Comments: Reviewed THPs with pt and husband.  She doesn't feel like she would use AE; she would rather have husband assist her.  Pt has catheter--practiced SPT to 3:1 as catheter will come out later.  Pt had difficulty following motoric cues for placing UEs and LEs--would move incorrect leg or in direction opposite of what was cued.  Multimodal cues given.  Pt needed cues for no internal rotation with transfers.  She also needed cues for 90 degrees when scooting forward in chair.  Extra time required for all tasks     Vision                     Perception     Praxis      Pertinent Vitals/Pain Pain Assessment: 0-10 Pain Score: 5  Pain Location: L hip Pain Descriptors / Indicators: Sore (stiff) Pain Intervention(s): Limited activity within patient's tolerance;Repositioned;Ice applied     Hand Dominance     Extremity/Trunk Assessment Upper Extremity Assessment Upper Extremity Assessment: Overall WFL for tasks assessed           Communication Communication Communication: No difficulties   Cognition Arousal/Alertness: Awake/alert Behavior During Therapy: WFL for tasks assessed/performed Overall Cognitive Status: Impaired/Different from baseline (had difficulty following motoric direction cues)  General Comments       Exercises       Shoulder Instructions      Home Living Family/patient expects to be discharged to:: Private residence Living Arrangements: Spouse/significant other Available Help at Discharge: Family Type of Home: House Home Access: Stairs to enter Technical brewer of Steps: 3   Home Layout: One level     Bathroom Shower/Tub: Occupational psychologist: [place]: None   Additional Comments: 3;1 was delivered to room      Prior Functioning/Environment Level of Independence: Independent        Comments: pt still drives      OT Diagnosis: Generalized weakness   OT Problem List: Decreased strength;Decreased activity tolerance;Impaired balance (sitting and/or standing);Decreased knowledge of use of DME or AE;Decreased knowledge of precautions;Pain   OT Treatment/Interventions: Self-care/ADL training;DME and/or AE instruction;Patient/family education;Balance training    OT Goals(Current goals can be found in the care plan section) Acute Rehab OT Goals Patient Stated Goal: to go home OT Goal Formulation: With patient/family Time For Goal Achievement: 09/13/13 Potential to Achieve Goals: Good ADL Goals Pt Will Transfer to Toilet: with min guard assist;ambulating;bedside commode Pt Will Perform Toileting - Clothing Manipulation and hygiene: with min guard assist;sit to/from stand Pt Will Perform Tub/Shower Transfer: with min assist;ambulating;3 in 1;Shower transfer Additional ADL Goal #1: pt will recall 3/3 thps without cues  OT Frequency: Min 2X/week   Barriers to D/C:            Co-evaluation              End of Session Nurse Communication: Mobility status  Activity Tolerance: Patient tolerated treatment well Patient left: in chair;with call bell/phone within reach   Time: 5830-9407 OT Time Calculation (min): 33 min Charges:  OT General Charges $OT Visit: 1 Procedure OT Evaluation $Initial OT Evaluation Tier I: 1 Procedure OT Treatments $Self Care/Home Management : 23-37 mins G-Codes:    Alexa Middleton October 01, 2013, 3:41 PM  Lesle Chris, OTR/L [phone] 10-01-13

## 2013-09-07 LAB — CBC
HEMATOCRIT: 29.7 % — AB (ref 36.0–46.0)
HEMOGLOBIN: 9.8 g/dL — AB (ref 12.0–15.0)
MCH: 29 pg (ref 26.0–34.0)
MCHC: 32.7 g/dL (ref 30.0–36.0)
MCV: 88.7 fL (ref 78.0–100.0)
Platelets: 256 10*3/uL (ref 150–400)
RBC: 3.35 MIL/uL — ABNORMAL LOW (ref 3.87–5.11)
RDW: 15 % (ref 11.5–15.5)
WBC: 12.6 10*3/uL — ABNORMAL HIGH (ref 4.0–10.5)

## 2013-09-07 LAB — BASIC METABOLIC PANEL
Anion gap: 10 (ref 5–15)
BUN: 13 mg/dL (ref 6–23)
CHLORIDE: 99 meq/L (ref 96–112)
CO2: 28 meq/L (ref 19–32)
Calcium: 8.5 mg/dL (ref 8.4–10.5)
Creatinine, Ser: 0.5 mg/dL (ref 0.50–1.10)
GFR calc Af Amer: >90 mL/min (ref >90)
GFR, EST NON AFRICAN AMERICAN: 86 mL/min — AB (ref >90)
GLUCOSE: 133 mg/dL — AB (ref 70–99)
POTASSIUM: 4.4 meq/L (ref 3.7–5.3)
Sodium: 137 mEq/L (ref 137–147)

## 2013-09-07 LAB — HEMOGLOBIN A1C
Hgb A1c MFr Bld: 6.4 % — ABNORMAL HIGH (ref <5.7)
MEAN PLASMA GLUCOSE: 137 mg/dL — AB (ref <117)

## 2013-09-07 NOTE — Progress Notes (Signed)
Subjective: 2 Days Post-Op Procedure(s) (LRB): ARTHROPLASTY BIPOLAR HIP (Left) Patient reports pain as 3 on 0-10 scale.    Objective: Vital signs in last 24 hours: Temp:  [97.9 F (36.6 C)-98.6 F (37 C)] 98.6 F (37 C) (08/29 0345) Pulse Rate:  [86-92] 90 (08/29 0345) Resp:  [16-18] 16 (08/29 0345) BP: (130-138)/(42-52) 138/52 mmHg (08/29 0345) SpO2:  [90 %-100 %] 96 % (08/29 0345)  Intake/Output from previous day: 08/28 0701 - 08/29 0700 In: 2450 [P.O.:600; I.V.:1800; IV Piggyback:50] Out: 2100 [Urine:2100] Intake/Output this shift:     Recent Labs  09/05/13 1413 09/06/13 0457 09/07/13 0442  HGB 13.3 11.8* 9.8*    Recent Labs  09/06/13 0457 09/07/13 0442  WBC 11.1* 12.6*  RBC 4.02 3.35*  HCT 35.8* 29.7*  PLT 249 256    Recent Labs  09/06/13 0457 09/07/13 0442  NA 131* 137  K 5.4* 4.4  CL 96 99  CO2 26 28  BUN 9 13  CREATININE 0.51 0.50  GLUCOSE 156* 133*  CALCIUM 7.9* 8.5   No results found for this basename: LABPT, INR,  in the last 72 hours  Neurologically intact Neurovascular intact Sensation intact distally Incision: dressing C/D/I Compartment soft  Assessment/Plan: 2 Days Post-Op Procedure(s) (LRB): ARTHROPLASTY BIPOLAR HIP (Left) Advance diet Up with therapy D/C IV fluids  Alexa Middleton C 09/07/2013, 8:47 AM

## 2013-09-07 NOTE — Progress Notes (Signed)
09/07/13 1600  PT Visit Information  Last PT Received On 09/07/13  Assistance Needed +1  History of Present Illness 80 yo female adm after fall resulting in L hip fx, s/p L hip hemiarthroplasty; PMHx: HTN  PT Time Calculation  PT Start Time 1417  PT Stop Time 1432  PT Time Calculation (min) 15 min  Subjective Data  Patient Stated Goal to go home  Precautions  Precautions Fall;Posterior Hip  Restrictions  LLE Weight Bearing WBAT  Pain Assessment  Pain Assessment 0-10  Pain Score 2  Pain Location left hip  Pain Intervention(s) Monitored during session  Cognition  Arousal/Alertness Awake/alert  Behavior During Therapy WFL for tasks assessed/performed  Overall Cognitive Status Impaired/Different from baseline  Area of Impairment Following commands;Awareness  Memory Decreased recall of precautions;Decreased short-term memory  Bed Mobility  Overal bed mobility Needs Assistance  Bed Mobility Supine to Sit  General Exercises - Lower Extremity  Ankle Circles/Pumps AROM;Both;10 reps  Quad Sets AROM;Both;10 reps  Heel Slides AAROM;Left;10 reps  Hip ABduction/ADduction AROM;AAROM;Left;10 reps  PT - End of Session  Equipment Utilized During Treatment Gait belt  Activity Tolerance Patient tolerated treatment well  Patient left in chair;with call bell/phone within reach;with family/visitor present  Nurse Communication Mobility status  PT - Assessment/Plan  PT Plan Discharge plan needs to be updated  PT Frequency Min 4X/week  Follow Up Recommendations SNF  PT equipment Rolling walker with 5" wheels  PT Goal Progression  Progress towards PT goals Progressing toward goals  Acute Rehab PT Goals  PT Goal Formulation With patient  Time For Goal Achievement 09/12/13  Potential to Achieve Goals Good  PT General Charges  $$ ACUTE PT VISIT 1 Procedure  PT Treatments  $Therapeutic Exercise 8-22 mins

## 2013-09-07 NOTE — Clinical Social Work Note (Signed)
Clinical Social Work Department BRIEF PSYCHOSOCIAL ASSESSMENT 09/07/2013  Patient:  SHAMECCA, WHITEBREAD     Account Number:  [id number]     Admit date:  09/05/2013  Clinical Social Worker:  Wendie Simmer  Date/Time:  09/07/2013 02:00 PM  Referred by:  Physician  Date Referred:  09/07/2013 Referred for  SNF Placement   Other Referral:   Interview type:  Patient Other interview type:   Chart Review and Spouse    PSYCHOSOCIAL DATA Living Status:  HUSBAND Admitted from facility:   Level of care:   Primary support name:  Minda Ditto Primary support relationship to patient:  SPOUSE Degree of support available:   Very supportive    CURRENT CONCERNS Current Concerns  Post-Acute Placement   Other Concerns:    SOCIAL WORK ASSESSMENT / PLAN CSW spoke with pt and pt's spouse in room.  Pt is agreeable to SNF for discharge.  CSW provided list of SNF options. CSW completed FL2 and will fax out information.  CSW sent clinical information to insurance for prior approval.  CSW will continue to follow to assist with discharge planning.   Assessment/plan status:  Psychosocial Support/Ongoing Assessment of Needs Other assessment/ plan:   Information/referral to community resources:    PATIENT'S/FAMILY'S RESPONSE TO PLAN OF CARE: Pt and pt's spouse were agreeable to SNF and were appreciative of CSW assistance.

## 2013-09-07 NOTE — Progress Notes (Signed)
Occupational Therapy Treatment Patient Details Name: Alexa Middleton MRN: [id number] DOB: 1927/05/08 Today's Date: 09/07/2013    History of present illness 80 yo female adm after fall resulting in L hip fx, s/p L hip hemiarthroplasty; PMHx: HTN   OT comments  Pt and husband now interested in SNF. SW alerted  Follow Up Recommendations  SNF          Precautions / Restrictions Precautions Precautions: Fall;Posterior Hip Restrictions Weight Bearing Restrictions: No LLE Weight Bearing: Weight bearing as tolerated       Mobility Bed Mobility Overal bed mobility: Needs Assistance Bed Mobility: Supine to Sit     Supine to sit: +2 for physical assistance;Max assist     General bed mobility comments: pt in chair  Transfers Overall transfer level: Needs assistance Equipment used: Rolling walker (2 wheeled) Transfers: Sit to/from Stand Sit to Stand: Mod assist         General transfer comment: cues for UE/LE placement and THPs    Balance             Standing balance-Leahy Scale: Zero                     ADL Overall ADL's : Needs assistance/impaired                                       General ADL Comments: reviewed THP. Pt and husband overwhelmed and now would like to pursue SNF for further rehab prior to going home. OT did alert SW they are coming to discuss with pt                Cognition   Behavior During Therapy: [facility] for tasks assessed/performed Overall Cognitive Status: Impaired/Different from baseline Area of Impairment: Following commands;Awareness     Memory: Decreased recall of precautions;Decreased short-term memory  Following Commands: Follows one step commands with increased time       General Comments: pt very overwhelmed            General Comments      Pertinent Vitals/ Pain       Pain Assessment: 0-10 Pain Score: 4  Pain Location: l hip Pain Descriptors / Indicators: Sore Pain Intervention(s):  Limited activity within patient's tolerance;Monitored during session         Frequency Min 2X/week     Progress Toward Goals  OT Goals(current goals can now be found in the care plan section)  Progress towards OT goals: Progressing toward goals  Acute Rehab OT Goals Patient Stated Goal: to go home  Plan Discharge plan needs to be updated       End of Session Equipment Utilized During Treatment: Rolling walker   Activity Tolerance Patient tolerated treatment well   Patient Left in chair;with call bell/phone within reach;with family/visitor present   Nurse Communication Mobility status        Time: 9024-0973 OT Time Calculation (min): 19 min  Charges: OT General Charges $OT Visit: 1 Procedure OT Treatments $Self Care/Home Management : 8-22 mins  Van Seymore D 09/07/2013, 1:16 PM

## 2013-09-07 NOTE — Progress Notes (Signed)
[facility] PROGRESS NOTE  Alexa Middleton JJO:[id number] DOB: [birth year] DOA: 09/05/2013 PCP: No primary provider on file.  Assessment/Plan  Subcapital fracture of left hip s/p left hip hemiarthoplasty on 8/27 by Dr. Wynelle Link -  WB and DVT proph per ortho  -  continue colace/senna plus prn stool softeners and laxatives -  Clarified pain medications for mild, moderate, severe, and breakthrough pain  -  IS -  PT/OT recommending possible HH OT/PT with equipment  HTN (hypertension), low diastolic and intermittently elevated systolic -  Continue losartan and Amlodipine   Hyperlipidemia, stable, continue statin  Mild hyponatremia, stable, asymptomatic and suspect some hemolysis on this AM's labs -  Diet now advanced  Mild hyperkalemia likely due to hemolysis and resolved spontaneously  Mild leukocytosis, likely reactive from surgery, asymptomatic -  Trend WBC  Acute blood loss anemia due to surgery, hemoglobin decreased today -  Mild and asymptomatic -  Repeat in AM  Hyperglycemia, likely stress-related from surgery -  A1c pending  Diet:  regular Access:  PIV IVF:  OFF Proph:  lovenox  Code Status: full Family Communication: patient alone Disposition Plan:   Wants to go home with [facility] services, likely in a few days  Consultants:  Orthopedics, Dr. Wynelle Link  Procedures:  Left hip hemiarthroplasty on 8/27   CXR   XR knee  XR left elbow  XR left hip  Antibiotics:  Cefazolin perioperatively    HPI/Subjective:  Denies pain, SOB, chest pain, nausea, vomiting.  Had BM yesterday.  Voiding since catheter removed  Objective: Filed Vitals:   09/06/13 1420 09/06/13 2030 09/07/13 0107 09/07/13 0345  BP: 130/42 137/49 132/48 138/52  Pulse: 92  86 90  Temp: 97.9 F (36.6 C) 98.2 F (36.8 C) 98.5 F (36.9 C) 98.6 F (37 C)  TempSrc: Oral Oral Oral Oral  Resp: 16 18 16 16   Height:      Weight:      SpO2: 100% 100% 99% 96%    Intake/Output Summary (Last 24  hours) at 09/07/13 0819 Last data filed at 09/07/13 0600  Gross per 24 hour  Intake   2450 ml  Output   2100 ml  Net    350 ml   Filed Weights   09/05/13 1711  Weight: 53.071 kg (117 lb)    Exam:   General:  WF, No acute distress  HEENT:  NCAT, MMM  Cardiovascular:  RRR, nl S1, S2 no mrg, 2+ pulses, warm extremities  Respiratory:  CTAB, no increased WOB  Abdomen:   NABS, soft, NT/ND  MSK:   Normal tone and bulk, no LEE.  Left hip dressing c/d/i except for one area on more distal dressing with dried blood, no swelling or ecchymosis  Neuro:  Grossly intact  Data Reviewed: Basic Metabolic Panel:  Recent Labs Lab 09/05/13 1413 09/06/13 0457 09/07/13 0442  NA 131* 131* 137  K 4.2 5.4* 4.4  CL 92* 96 99  CO2 26 26 28   GLUCOSE 191* 156* 133*  BUN 12 9 13   CREATININE 0.56 0.51 0.50  CALCIUM 8.9 7.9* 8.5   Liver Function Tests: No results found for this basename: AST, ALT, ALKPHOS, BILITOT, PROT, ALBUMIN,  in the last 168 hours No results found for this basename: LIPASE, AMYLASE,  in the last 168 hours No results found for this basename: AMMONIA,  in the last 168 hours CBC:  Recent Labs Lab 09/05/13 1413 09/06/13 0457 09/07/13 0442  WBC 10.1 11.1* 12.6*  NEUTROABS 8.4*  --   --  HGB 13.3 11.8* 9.8*  HCT 39.5 35.8* 29.7*  MCV 86.8 89.1 88.7  PLT 308 249 256   Cardiac Enzymes: No results found for this basename: CKTOTAL, CKMB, CKMBINDEX, TROPONINI,  in the last 168 hours BNP (last 3 results) No results found for this basename: PROBNP,  in the last 8760 hours CBG: No results found for this basename: GLUCAP,  in the last 168 hours  Recent Results (from the past 240 hour(s))  SURGICAL PCR SCREEN     Status: None   Collection Time    09/05/13  5:58 PM      Result Value Ref Range Status   MRSA, PCR NEGATIVE  NEGATIVE Final   Staphylococcus aureus NEGATIVE  NEGATIVE Final   Comment:            The Xpert SA Assay (FDA     approved for NASAL specimens      in patients over 68 years of age),     is one component of     a comprehensive surveillance     program.  Test performance has     been validated by [employer] for patients greater     than or equal to 63 year old.     It is not intended     to diagnose infection nor to     guide or monitor treatment.     Studies: Dg Chest 2 View  09/05/2013   CLINICAL DATA:  Preoperative hip surgery  EXAM: CHEST  2 VIEW  COMPARISON:  July 16, 2009  FINDINGS: Underlying emphysematous changes present. There is scarring in both upper lobes and base regions. There is no frank edema or consolidation. The heart size is within normal limits. Pulmonary vascularity reflects underlying emphysema. No adenopathy. There is atherosclerotic change in the aorta. No bone lesions.  IMPRESSION: Emphysema with areas of scarring bilaterally. No appreciable edema or consolidation.   Electronically Signed   By: Lowella Grip M.D.   On: 09/05/2013 15:53   Dg Elbow Complete Left  09/05/2013   CLINICAL DATA:  Status post fall.  EXAM: LEFT ELBOW - COMPLETE 3+ VIEW  COMPARISON:  None.  FINDINGS: There is no evidence of fracture, dislocation, or joint effusion. There is no evidence of arthropathy or other focal bone abnormality. Soft tissues are unremarkable.  IMPRESSION: Negative exam.   Electronically Signed   By: Inge Rise M.D.   On: 09/05/2013 15:54   Dg Hip Complete Left  09/05/2013   CLINICAL DATA:  Status post fall.  Left hip pain.  EXAM: LEFT HIP - COMPLETE 2+ VIEW  COMPARISON:  None.  FINDINGS: The patient has an acute subcapital fracture of the left hip. No other acute bony or joint abnormality is identified. Lower lumbar degenerative change is noted.  IMPRESSION: Acute subcapital fracture left hip.   Electronically Signed   By: Inge Rise M.D.   On: 09/05/2013 15:59   Dg Knee 1-2 Views Left  09/05/2013   CLINICAL DATA:  Pain.  EXAM: LEFT KNEE - 1-2 VIEW  COMPARISON:  None.  FINDINGS: Soft tissue  structures are unremarkable. Mild tricompartment degenerative change. No evidence of fracture or dislocation .  IMPRESSION: Mild tricompartment degenerative change.  No acute abnormality.   Electronically Signed   By: Marcello Moores  Register   On: 09/05/2013 15:54   Dg Pelvis Portable  09/06/2013   CLINICAL DATA:  Postoperative  EXAM: PORTABLE PELVIS 1-2 VIEWS  COMPARISON:  Abdomen for 01/2005  FINDINGS: Interval left hip arthroplasty with cemented femoral component. Component appears well seated. Pelvis appears intact. No displaced fractures identified. Soft tissue gas consistent with recent surgery.  IMPRESSION: Left hip arthroplasty with component appearing well-seated.   Electronically Signed   By: Lucienne Capers M.D.   On: 09/06/2013 00:57    Scheduled Meds: . amLODipine  10 mg Oral Daily  . atorvastatin  10 mg Oral QHS  . docusate sodium  100 mg Oral BID  . enoxaparin (LOVENOX) injection  40 mg Subcutaneous Q24H  . losartan  100 mg Oral Daily  . senna  2 tablet Oral QHS  . sodium chloride  3 mL Intravenous Q12H   Continuous Infusions: . sodium chloride 75 mL/hr at 09/06/13 2046    Principal Problem:   Subcapital fracture of left hip Active Problems:   Hip fracture   HTN (hypertension)   Hyperlipidemia   Hyponatremia   Leukocytosis, unspecified   Postoperative anemia due to acute blood loss    Time spent: 30 min    Shae Hinnenkamp, [facility]  [facility] Pager [phone]. If 7PM-7AM, please contact night-coverage at [email or web address], password [facility] 09/07/2013, 8:19 AM  LOS: 2 days

## 2013-09-07 NOTE — Progress Notes (Signed)
Physical Therapy Treatment Patient Details Name: Alexa Middleton MRN: [id number] DOB: 18-Jan-1927 Today's Date: 09/07/2013    History of Present Illness 80 yo female adm after fall resulting in L hip fx, s/p L hip hemiarthroplasty; PMHx: HTN    PT Comments    Pt reports she was awake all night thinking about "where to go"; discussed SNF option again with pt and she reports she is agreeable at this time and realizes her husband can't assist her at her current level; I do feel she would certainly benefit from SNF post acute; she is requiring more assist overall today  Follow Up Recommendations  SNF     Equipment Recommendations  Rolling walker with 5" wheels    Recommendations for Other Services       Precautions / Restrictions Precautions Precautions: Fall;Posterior Hip Restrictions LLE Weight Bearing: Weight bearing as tolerated    Mobility  Bed Mobility Overal bed mobility: Needs Assistance Bed Mobility: Supine to Sit     Supine to sit: +2 for physical assistance;Max assist     General bed mobility comments: pt required +2 assist for trunk and LEs, scooting;  Transfers Overall transfer level: Needs assistance Equipment used: Rolling walker (2 wheeled) Transfers: Sit to/from Stand Sit to Stand: +2 physical assistance;Mod assist         General transfer comment: cues for UE/LE placement and THPs  Ambulation/Gait Ambulation/Gait assistance: +2 physical assistance;Mod assist Ambulation Distance (Feet): 22 Feet Assistive device: Rolling walker (2 wheeled) Gait Pattern/deviations: Step-to pattern;Antalgic;Trunk flexed   Gait velocity interpretation: Below normal speed for age/gender General Gait Details: pt leaning right and requring incr time for sequencing, RW position and safety; requring incr assist for amb today   Stairs            Wheelchair Mobility    Modified Rankin (Stroke Patients Only)       Balance             Standing  balance-Leahy Scale: Zero                      Cognition Arousal/Alertness: Awake/alert Behavior During Therapy: WFL for tasks assessed/performed Overall Cognitive Status: Impaired/Different from baseline Area of Impairment: Following commands;Awareness     Memory: Decreased recall of precautions;Decreased short-term memory Following Commands: Follows one step commands with increased time            Exercises      General Comments        Pertinent Vitals/Pain Pain Assessment: 0-10 Pain Score: 5  Pain Location: L hip  Pain Intervention(s): Limited activity within patient's tolerance;Monitored during session    Home Living                      Prior Function            PT Goals (current goals can now be found in the care plan section) Acute Rehab PT Goals Patient Stated Goal: to go home Time For Goal Achievement: 09/12/13 Potential to Achieve Goals: Good Progress towards PT goals: Progressing toward goals (slowly)    Frequency  Min 4X/week    PT Plan Discharge plan needs to be updated    Co-evaluation             End of Session Equipment Utilized During Treatment: Gait belt Activity Tolerance: Patient tolerated treatment well Patient left: in chair;with call bell/phone within reach     Time: 1145-1208 PT Time Calculation (min): 23  min  Charges:  $Gait Training: 23-37 mins                    G Codes:      Alexa Middleton Sep 15, 2013, 12:38 PM

## 2013-09-08 ENCOUNTER — Inpatient Hospital Stay (HOSPITAL_COMMUNITY): Payer: Medicare Other

## 2013-09-08 ENCOUNTER — Encounter (HOSPITAL_COMMUNITY): Payer: Self-pay | Admitting: Physician Assistant

## 2013-09-08 ENCOUNTER — Other Ambulatory Visit: Payer: Self-pay

## 2013-09-08 DIAGNOSIS — E785 Hyperlipidemia, unspecified: Secondary | ICD-10-CM

## 2013-09-08 DIAGNOSIS — J690 Pneumonitis due to inhalation of food and vomit: Secondary | ICD-10-CM | POA: Diagnosis present

## 2013-09-08 DIAGNOSIS — I4891 Unspecified atrial fibrillation: Secondary | ICD-10-CM

## 2013-09-08 DIAGNOSIS — I959 Hypotension, unspecified: Secondary | ICD-10-CM

## 2013-09-08 DIAGNOSIS — D62 Acute posthemorrhagic anemia: Secondary | ICD-10-CM

## 2013-09-08 LAB — PHOSPHORUS: Phosphorus: 3.1 mg/dL (ref 2.3–4.6)

## 2013-09-08 LAB — PROTIME-INR
INR: 1.03 (ref 0.00–1.49)
PROTHROMBIN TIME: 13.5 s (ref 11.6–15.2)

## 2013-09-08 LAB — CBC
HCT: 27.6 % — ABNORMAL LOW (ref 36.0–46.0)
HEMOGLOBIN: 9.3 g/dL — AB (ref 12.0–15.0)
MCH: 29.2 pg (ref 26.0–34.0)
MCHC: 33.7 g/dL (ref 30.0–36.0)
MCV: 86.8 fL (ref 78.0–100.0)
Platelets: 260 10*3/uL (ref 150–400)
RBC: 3.18 MIL/uL — ABNORMAL LOW (ref 3.87–5.11)
RDW: 15.2 % (ref 11.5–15.5)
WBC: 13.8 10*3/uL — AB (ref 4.0–10.5)

## 2013-09-08 LAB — BASIC METABOLIC PANEL
Anion gap: 12 (ref 5–15)
BUN: 16 mg/dL (ref 6–23)
CALCIUM: 7.9 mg/dL — AB (ref 8.4–10.5)
CO2: 26 mEq/L (ref 19–32)
Chloride: 90 mEq/L — ABNORMAL LOW (ref 96–112)
Creatinine, Ser: 0.56 mg/dL (ref 0.50–1.10)
GFR calc Af Amer: >90 mL/min (ref >90)
GFR, EST NON AFRICAN AMERICAN: 83 mL/min — AB (ref >90)
GLUCOSE: 172 mg/dL — AB (ref 70–99)
Potassium: 4.2 mEq/L (ref 3.7–5.3)
Sodium: 128 mEq/L — ABNORMAL LOW (ref 137–147)

## 2013-09-08 LAB — MAGNESIUM: Magnesium: 2 mg/dL (ref 1.5–2.5)

## 2013-09-08 LAB — APTT: aPTT: 36 seconds (ref 24–37)

## 2013-09-08 LAB — TROPONIN I: Troponin I: <0.3 ng/mL (ref <0.30)

## 2013-09-08 LAB — MRSA PCR SCREENING: MRSA BY PCR: NEGATIVE

## 2013-09-08 LAB — D-DIMER, QUANTITATIVE: D-Dimer, Quant: 1.64 ug/mL-FEU — ABNORMAL HIGH (ref 0.00–0.48)

## 2013-09-08 MED ORDER — ENOXAPARIN SODIUM 40 MG/0.4ML ~~LOC~~ SOLN: 40.0000 mg | SUBCUTANEOUS | Status: DC

## 2013-09-08 MED ORDER — HEPARIN (PORCINE) IN NACL 100-0.45 UNIT/ML-% IJ SOLN
750.0000 [IU]/h | INTRAMUSCULAR | Status: DC
  Filled 2013-09-08: qty 250

## 2013-09-08 MED ORDER — SODIUM CHLORIDE 0.9 % IV SOLN
INTRAVENOUS | Status: DC
  Administered 2013-09-08: 17:00:00 via INTRAVENOUS

## 2013-09-08 MED ORDER — METHOCARBAMOL 500 MG PO TABS: 500.0000 mg | ORAL_TABLET | Freq: Four times a day (QID) | ORAL | Status: DC | PRN

## 2013-09-08 MED ORDER — TRAMADOL HCL 50 MG PO TABS: 50.0000 mg | ORAL_TABLET | Freq: Four times a day (QID) | ORAL | Status: DC | PRN

## 2013-09-08 MED ORDER — HYDROCODONE-ACETAMINOPHEN 5-325 MG PO TABS: 1.0000 | ORAL_TABLET | ORAL | Status: DC | PRN

## 2013-09-08 MED ORDER — SODIUM CHLORIDE 0.9 % IV BOLUS (SEPSIS)
500.0000 mL | Freq: Once | INTRAVENOUS | Status: AC
  Administered 2013-09-08: 500 mL via INTRAVENOUS

## 2013-09-08 MED ORDER — HEPARIN BOLUS VIA INFUSION
2600.0000 [IU] | Freq: Once | INTRAVENOUS | Status: DC
  Filled 2013-09-08: qty 2600

## 2013-09-08 MED ORDER — SODIUM CHLORIDE 0.9 % IV BOLUS (SEPSIS): 500.0000 mL | Freq: Once | INTRAVENOUS | Status: DC

## 2013-09-08 MED ORDER — IOHEXOL 350 MG/ML SOLN
100.0000 mL | Freq: Once | INTRAVENOUS | Status: AC | PRN
  Administered 2013-09-08: 100 mL via INTRAVENOUS

## 2013-09-08 MED ORDER — AMOXICILLIN-POT CLAVULANATE 875-125 MG PO TABS
1.0000 | ORAL_TABLET | Freq: Two times a day (BID) | ORAL | Status: DC
  Administered 2013-09-08: 1 via ORAL
  Filled 2013-09-08 (×3): qty 1

## 2013-09-08 MED ORDER — DIGOXIN 0.25 MG/ML IJ SOLN
0.2500 mg | Freq: Once | INTRAMUSCULAR | Status: DC
  Filled 2013-09-08: qty 1

## 2013-09-08 NOTE — Progress Notes (Signed)
CARE MANAGEMENT NOTE 09/08/2013  Patient:  Alexa Middleton, Alexa Middleton   Account Number:  [id number]  Date Initiated:  09/06/2013  Documentation initiated by:  DAVIS,RHONDA  Subjective/Objective Assessment:   patient sustained fall at home and resulted in fracture of the left hip requiring surgical intervention.     Action/Plan:   home with hhc and family support/patient lives at home with spouse and has been indep. in adls and activites   Anticipated DC Date:  09/09/2013   Anticipated DC Plan:  SKILLED NURSING FACILITY  In-house referral  Clinical Social Worker      DC Planning Services  CM consult      [facility] Choice  NA   Choice offered to / List presented to:  C-1 Patient   DME arranged  [name]      DME agency  [place].        Status of service:  Completed, signed off Medicare Important Message given?  YES (If response is "NO", the following Medicare IM given date fields will be blank) Date Medicare IM given:  09/08/2013 Medicare IM given by:  [facility] Date Additional Medicare IM given:   Additional Medicare IM given by:    Discharge Disposition:  [place]  Per UR Regulation:  Reviewed for med. necessity/level of care/duration of stay  If discussed at [place] of Stay Meetings, dates discussed:    Comments:  09/08/2013 Pt has RW and 3n1 in the room. Will have family take home. Plan is for dc to SNF -rehab. [name] RN CCM Case Mgmt phone [phone]  [facility]

## 2013-09-08 NOTE — Progress Notes (Signed)
ANTICOAGULATION CONSULT NOTE - Initial Consult  Pharmacy Consult for Heparin Indication: atrial fibrillation  No Known Allergies  Patient Measurements: Height: 5\' 2"  (157.5 cm) Weight: 117 lb (53.071 kg) IBW/kg (Calculated) : 50.1 Heparin Dosing Weight: 53.1 kg  Vital Signs: Temp: 98.6 F (37 C) (08/30 1353) Temp src: Oral (08/30 1353) BP: 92/44 mmHg (08/30 1353) Pulse Rate: 155 (08/30 1353)  Labs:  Recent Labs  09/06/13 0457 09/07/13 0442 09/08/13 0359  HGB 11.8* 9.8* 9.3*  HCT 35.8* 29.7* 27.6*  PLT 249 256 260  CREATININE 0.51 0.50  --     Estimated Creatinine Clearance: 41.4 ml/min (by C-G formula based on Cr of 0.5).   Medical History: Past Medical History  Diagnosis Date  . Cancer     skin cancer to left leg  . Hypertension     Medications:  Prescriptions prior to admission  Medication Sig Dispense Refill  . amLODipine (NORVASC) 10 MG tablet Take 10 mg by mouth daily.      Marland Kitchen atorvastatin (LIPITOR) 10 MG tablet Take 10 mg by mouth daily at 6 PM.      . losartan (COZAAR) 100 MG tablet Take 100 mg by mouth daily.       Infusions:    Assessment: Alexa Middleton here following hip Fx and arthroplasty.  Preparing for discharge when noted to be in A-fib/flutter.  Baseline labs pending; no prior home anticoagulation.  Ordered digoxin IV load.    CBC low but stable post-op  Goal of Therapy:  Heparin level 0.3-0.7 units/ml Monitor platelets by anticoagulation protocol: Yes   Plan:  Give 2600 units bolus x 1 Start heparin infusion at 750 units/hr Check anti-Xa level in 8 hours and daily while on heparin Continue to monitor H&H and platelets  Reuel Boom, PharmD [phone] 09/08/2013 3:47 PM

## 2013-09-08 NOTE — Progress Notes (Signed)
Called by RN because on routine vital signs, she was found to be tachycardic to 150s and mildly hypotensive.    S:  Denies CP, SOB, nausea, vomiting.  Denies palpitations and lightheadedness O:  HR 130s-150s, BP 92/44, low 90s on RA so placed on 2L [place] and up to high 90s Gen:  NAD CV:  IRRR, tachycardic PULM:  No new rales  ECG:  Possibly a-fib but occasional p-waves are visible.  ? A-flutter with variable conduction but hard to tell from one ECG  A/P:   New onset tachycardia, likely a-fib or a-flutter with RVR.  May have been triggered by surgery, but will screen for ACS, thyroid abnl, valvular abnl, and PE -  BMP -  D-dimer:  Elevated, CT angio chest ordered -  TSH with AML -  Troponins:  1st is negative -  ECHO once HR slows -  Telemetry -  Digoxin 272mcg x 1 now to try to slow rate some -  D/c lovenox and start heparin gtt -  Transfer to stepdown -  Case discussed with cardiology who are going to consult

## 2013-09-08 NOTE — Progress Notes (Signed)
Routine vital signs checked, BP 92/44, P155.  Patient lying in bed denies any symptoms, feeling of heart racing, lightheaded/dizzy, chest pain, sob.  Patient placed back on CPOX and maintaining HR of 130's to 157.  O2 sat 91% on RA, placed O2 @ 2L, sats up to 100%.  Dr. Sheran Fava had just assessed patient and  left floor prior to v/s check, paged Dr. Sheran Fava to notify of above.   EKG done per MD showing Afib.  500cc bolus NS infusing.  Dr. Sheran Fava on floor to reassess pt, order to transfer patient to telemetry monitor floor.  Christen Bame RN

## 2013-09-08 NOTE — Progress Notes (Signed)
Clinical Social Work  CSW met with patient at bedside to review bed offers. Patient prefers either [facility] or [facility]. [facility] was able to offer a bed but [facility] still reviewing. CSW explained that CSW will follow up in the morning and alert her with [facility]'s decision. CSW faxed clinicals to [employer] for authorization.   , LCSW  (Weekend Coverage) 

## 2013-09-08 NOTE — Progress Notes (Signed)
   Subjective: 3 Days Post-Op Procedure(s) (LRB): ARTHROPLASTY BIPOLAR HIP (Left) Patient reports pain as moderate.   Patient seen in rounds with Dr. Gladstone Lighter. Patient is well, and has had no acute complaints or problems other than discomfort in the left hip. She denies SOB and chest pain. No issues overnight. Voiding well.  Plan is to go Skilled nursing facility after hospital stay.  Objective: Vital signs in last 24 hours: Temp:  [98.5 F (36.9 C)-99.1 F (37.3 C)] 98.5 F (36.9 C) (08/30 0555) Pulse Rate:  [93-95] 94 (08/30 0555) Resp:  [16] 16 (08/30 0555) BP: (110-118)/(33-64) 110/64 mmHg (08/30 0555) SpO2:  [92 %-96 %] 96 % (08/30 0555)  Intake/Output from previous day:  Intake/Output Summary (Last 24 hours) at 09/08/13 0925 Last data filed at 09/08/13 0923  Gross per 24 hour  Intake   1083 ml  Output   1775 ml  Net   -692 ml    Intake/Output this shift: Total I/O In: 240 [P.O.:240] Out: -   Labs:  Recent Labs  09/05/13 1413 09/06/13 0457 09/07/13 0442 09/08/13 0359  HGB 13.3 11.8* 9.8* 9.3*    Recent Labs  09/07/13 0442 09/08/13 0359  WBC 12.6* 13.8*  RBC 3.35* 3.18*  HCT 29.7* 27.6*  PLT 256 260    Recent Labs  09/06/13 0457 09/07/13 0442  NA 131* 137  K 5.4* 4.4  CL 96 99  CO2 26 28  BUN 9 13  CREATININE 0.51 0.50  GLUCOSE 156* 133*  CALCIUM 7.9* 8.5    EXAM General - Patient is Alert and Oriented Extremity - Neurologically intact Intact pulses distally Dorsiflexion/Plantar flexion intact Compartment soft Dressing/Incision - clean, dry, no drainage Motor Function - intact, moving foot and toes well on exam.   Past Medical History  Diagnosis Date  . Cancer     skin cancer to left leg  . Hypertension     Assessment/Plan: 3 Days Post-Op Procedure(s) (LRB): ARTHROPLASTY BIPOLAR HIP (Left) Principal Problem:   Subcapital fracture of left hip Active Problems:   Hip fracture   HTN (hypertension)   Hyperlipidemia  Hyponatremia   Leukocytosis, unspecified   Postoperative anemia due to acute blood loss  Estimated body mass index is 21.39 kg/(m^2) as calculated from the following:   Height as of this encounter: 5\' 2"  (1.575 m).   Weight as of this encounter: 53.071 kg (117 lb). Advance diet Up with therapy D/C IV fluids Plan for discharge tomorrow to SNF  DVT Prophylaxis - Lovenox Weight-Bearing as tolerated   She is doing fair. Having some increased discomfort with therapy. WBC has been creeping up. Will evaluate with CXR and UA. Plan for DC to SNF tomorrow. Continue PT today.   Ardeen Jourdain, PA-C Orthopaedic Surgery 09/08/2013, 9:25 AM

## 2013-09-08 NOTE — Progress Notes (Signed)
[facility] PROGRESS NOTE  Alexa Middleton VZC:[id number] DOB: [birth year] DOA: 09/05/2013 PCP: No primary provider on file.  Assessment/Plan  Subcapital fracture of left hip s/p left hip hemiarthoplasty on 8/27 by Dr. Wynelle Link -  WB and DVT proph per ortho  -  continue colace/senna plus prn stool softeners and laxatives -  Clarified pain medications for mild, moderate, severe, and breakthrough pain  -  IS -  PT/OT recommending possible HH OT/PT with equipment  Possible pneumonia, although afebrile.  -  Start augmentin BID and plan to treat for 5-7 days  HTN (hypertension), low diastolic and intermittently elevated systolic -  Continue losartan and Amlodipine   Hyperlipidemia, stable, continue statin  Mild hyponatremia, stable, asymptomatic and suspect some hemolysis on this AM's labs -  Diet now advanced  Mild hyperkalemia likely due to hemolysis and resolved spontaneously  Mild leukocytosis, likely reactive from surgery, asymptomatic, rose slightly today -  CXR:  Possible LLL pneumonia (difficult to discern given underlying scarring) -  UA pending -  Trend WBC  Acute blood loss anemia due to surgery, hemoglobin decreased today -  Mild and asymptomatic -  Repeat in AM  Hyperglycemia, likely stress-related from surgery.  A1c 6.4.   Diet:  regular Access:  PIV IVF:  OFF Proph:  lovenox  Code Status: full Family Communication: patient alone Disposition Plan:   To SNF for rehab  Consultants:  Orthopedics, Dr. Wynelle Link  Procedures:  Left hip hemiarthroplasty on 8/27   CXR   XR knee  XR left elbow  XR left hip  Antibiotics:  Cefazolin perioperatively    HPI/Subjective:  Denies fevers, chills.  Has some cough that is improving since surgery.  Denies SOB, dysuria.    Objective: Filed Vitals:   09/07/13 0345 09/07/13 1354 09/07/13 2143 09/08/13 0555  BP: 138/52 118/33 114/56 110/64  Pulse: 90 93 95 94  Temp: 98.6 F (37 C) 99.1 F (37.3 C) 98.5 F  (36.9 C) 98.5 F (36.9 C)  TempSrc: Oral Oral Oral Oral  Resp: 16 16 16 16   Height:      Weight:      SpO2: 96% 92% 94% 96%    Intake/Output Summary (Last 24 hours) at 09/08/13 1218 Last data filed at 09/08/13 1000  Gross per 24 hour  Intake   1083 ml  Output   1675 ml  Net   -592 ml   Filed Weights   09/05/13 1711  Weight: 53.071 kg (117 lb)    Exam:   General:  WF, No acute distress  HEENT:  NCAT, MMM  Cardiovascular:  RRR, nl S1, S2 no mrg, 2+ pulses, warm extremities  Respiratory:  Some rales anterior right chest and diminished throughout but otherwise CTAB, no increased WOB  Abdomen:   NABS, soft, NT/ND  MSK:   Normal tone and bulk, no LEE.  Left hip dressing c/d/i except for one area on more distal dressing with dried blood, no swelling or ecchymosis  Neuro:  Grossly intact  Data Reviewed: Basic Metabolic Panel:  Recent Labs Lab 09/05/13 1413 09/06/13 0457 09/07/13 0442  NA 131* 131* 137  K 4.2 5.4* 4.4  CL 92* 96 99  CO2 26 26 28   GLUCOSE 191* 156* 133*  BUN 12 9 13   CREATININE 0.56 0.51 0.50  CALCIUM 8.9 7.9* 8.5   Liver Function Tests: No results found for this basename: AST, ALT, ALKPHOS, BILITOT, PROT, ALBUMIN,  in the last 168 hours No results found for this basename: LIPASE,  AMYLASE,  in the last 168 hours No results found for this basename: AMMONIA,  in the last 168 hours CBC:  Recent Labs Lab 09/05/13 1413 09/06/13 0457 09/07/13 0442 09/08/13 0359  WBC 10.1 11.1* 12.6* 13.8*  NEUTROABS 8.4*  --   --   --   HGB 13.3 11.8* 9.8* 9.3*  HCT 39.5 35.8* 29.7* 27.6*  MCV 86.8 89.1 88.7 86.8  PLT 308 249 256 260   Cardiac Enzymes: No results found for this basename: CKTOTAL, CKMB, CKMBINDEX, TROPONINI,  in the last 168 hours BNP (last 3 results) No results found for this basename: PROBNP,  in the last 8760 hours CBG: No results found for this basename: GLUCAP,  in the last 168 hours  Recent Results (from the past 240 hour(s))   SURGICAL PCR SCREEN     Status: None   Collection Time    09/05/13  5:58 PM      Result Value Ref Range Status   MRSA, PCR NEGATIVE  NEGATIVE Final   Staphylococcus aureus NEGATIVE  NEGATIVE Final   Comment:            The Xpert SA Assay (FDA     approved for NASAL specimens     in patients over 16 years of age),     is one component of     a comprehensive surveillance     program.  Test performance has     been validated by [employer] for patients greater     than or equal to 38 year old.     It is not intended     to diagnose infection nor to     guide or monitor treatment.     Studies: Dg Chest Port 1 View  09/08/2013   CLINICAL DATA:  80 year old female with leukocytosis status post surgery. Left hip arthroplasty. Initial encounter.  EXAM: PORTABLE CHEST - 1 VIEW  COMPARISON:  09/05/2013 and earlier.  FINDINGS: Portable AP semi upright view at 0944 hrs. Coarse bilateral pulmonary opacity. Stable lung volumes. Mildly increased left lung base opacity compared to prior Normal cardiac size and mediastinal contours. Visualized tracheal air column is within normal limits.  IMPRESSION: Chronic lung disease with superimposed patchy left lung base opacity suspicious for acute infectious exacerbation in this setting.   Electronically Signed   By: Lars Pinks M.D.   On: 09/08/2013 09:55    Scheduled Meds: . amLODipine  10 mg Oral Daily  . atorvastatin  10 mg Oral QHS  . docusate sodium  100 mg Oral BID  . enoxaparin (LOVENOX) injection  40 mg Subcutaneous Q24H  . losartan  100 mg Oral Daily  . senna  2 tablet Oral QHS  . sodium chloride  3 mL Intravenous Q12H   Continuous Infusions:    Principal Problem:   Subcapital fracture of left hip Active Problems:   Hip fracture   HTN (hypertension)   Hyperlipidemia   Hyponatremia   Leukocytosis, unspecified   Postoperative anemia due to acute blood loss    Time spent: 30 min    Anwita Mencer, [facility] Pager  [phone]. If 7PM-7AM, please contact night-coverage at [email or web address], password [email or web address] 09/08/2013, 12:18 PM  LOS: 3 days

## 2013-09-08 NOTE — Consult Note (Signed)
CARDIOLOGY CONSULT NOTE   Patient ID: Alexa Middleton MRN: [id number], DOB/AGE: Sep 08, 1927   Admit date: 09/05/2013 Date of Consult: 09/08/2013   Primary Physician: No primary provider on file. Primary Cardiologist: New  Pt. Profile  80 year old Caucasian female with past medical history significant for hypertension, and hyperlipidemia presented with L hip fx after tripped over her driveway. Postop day #3, had new onset a-fib/a-flutter with RVR  Problem List  Past Medical History  Diagnosis Date  . Cancer     skin cancer to left leg  . Hypertension     Past Surgical History  Procedure Laterality Date  . Cholecystectomy    . Abdominal hysterectomy    . Hip arthroplasty Left 09/05/2013    Procedure: ARTHROPLASTY BIPOLAR HIP;  Surgeon: Gearlean Alf, MD;  Location: [facility];  Service: Orthopedics;  Laterality: Left;     Allergies  No Known Allergies  HPI   The patient is a pleasant 80 year old Caucasian female with past medical history significant for hypertension, and hyperlipidemia. She has no prior cardiac history or any history of arrhythmia. She denies any previous history of stress test or cardiac catheterization. She states she has not been active at home, however can do most of household activities without significant discomfort or shortness of breath. She was walking off her driveway on 8/67/6720. Due to the different height between her lawn and her driveway, she tripped and fell. Afterward she began to have left hip pain and leg pain and was unable to walk. She was brought to the ED and the images confirmed she had a left hip fracture. An EKG obtained on 09/05/2013 on arrival showed sinus tachycardia with heart rate 106. Orthopedic was consulted by internal medicine group, and patient underwent a left hemiarthroplasty on 8/27. Patient has been recovering well since that time and was considering for discharge soon. She had some postop anemia with hemoglobin 9.3 today. Today,  her nursing staff noted her heart rate went up to 150s. Her blood pressure is normally in the 100 to 130s while on losartan and amlodipine. However her blood pressure around 2 PM today was 92/44. EKG was obtained which showed atrial fibrillation with RVR with heart rate of 144. Patient denies any dizziness, blurred vision or any cardiac awareness. She states she has been feeling well. Patient's Lovenox was switched to heparin drip. And that she was started on digoxin. No AV nodal blocking agent was added as her blood pressure was low. Patient is pending transfer to step down unit, cardiology was consulted for postop A. Fib  Of note, his chest x-ray was concerning for an infected process, patient was started on antibiotics.  Inpatient Medications  . amoxicillin-clavulanate  1 tablet Oral Q12H  . atorvastatin  10 mg Oral QHS  . digoxin  0.25 mg Intravenous Once  . docusate sodium  100 mg Oral BID  . heparin  2,600 Units Intravenous Once  . senna  2 tablet Oral QHS  . sodium chloride  500 mL Intravenous Once  . sodium chloride  3 mL Intravenous Q12H    Family History Family History  Problem Relation Age of Onset  . CAD Father     died of MI at age 73     Social History History   Social History  . Marital Status: Married    Spouse Name: N/A    Number of Children: N/A  . Years of Education: N/A   Occupational History  . Not on file.  Social History Main Topics  . Smoking status: Former Smoker    Quit date: 09/05/1968  . Smokeless tobacco: Never Used  . Alcohol Use: 4.2 oz/week    7 Glasses of wine per week     Comment: 1  glass wine daily  . Drug Use: No  . Sexual Activity: Not on file   Other Topics Concern  . Not on file   Social History Narrative  . No narrative on file     Review of Systems  General:  No chills, fever, night sweats or weight changes.  Cardiovascular:  No chest pain, dyspnea on exertion, edema, orthopnea, palpitations, paroxysmal nocturnal  dyspnea. Dermatological: No rash, lesions/masses Respiratory: No cough, dyspnea Urologic: No hematuria, dysuria Abdominal:   No nausea, vomiting, diarrhea, bright red blood per rectum, melena, or hematemesis. L abdominal pain. Neurologic:  No visual changes, wkns, changes in mental status. All other systems reviewed and are otherwise negative except as noted above.  Physical Exam  Blood pressure 92/44, pulse 155, temperature 98.6 F (37 C), temperature source Oral, resp. rate 16, height 5\' 2"  (1.575 m), weight 117 lb (53.071 kg), SpO2 93.00%.  General: Pleasant, NAD Psych: Normal affect. Neuro: Alert and oriented X 3. Moves all extremities spontaneously. HEENT: Normal  Neck: Supple without bruits or JVD. Lungs:  Resp regular and unlabored, decreased breath sound when listened from the back, anterior exam clear Heart: tachycardic regular no s3, s4, or murmurs. Abdomen: Soft, non-tender, non-distended, BS + x 4.  Extremities: No clubbing, cyanosis. DP/PT/Radials 2+ and equal bilaterally. LE in compression device.  Labs   Recent Labs  09/08/13 1454  TROPONINI <0.30   Lab Results  Component Value Date   WBC 13.8* 09/08/2013   HGB 9.3* 09/08/2013   HCT 27.6* 09/08/2013   MCV 86.8 09/08/2013   PLT 260 09/08/2013     Recent Labs Lab 09/08/13 1454  NA 128*  K 4.2  CL 90*  CO2 26  BUN 16  CREATININE 0.56  CALCIUM 7.9*  GLUCOSE 172*   No results found for this basename: CHOL,  HDL,  LDLCALC,  TRIG   Lab Results  Component Value Date   DDIMER 1.64* 09/08/2013    Radiology/Studies  Dg Chest 2 View  09/05/2013   CLINICAL DATA:  Preoperative hip surgery  EXAM: CHEST  2 VIEW  COMPARISON:  July 16, 2009  FINDINGS: Underlying emphysematous changes present. There is scarring in both upper lobes and base regions. There is no frank edema or consolidation. The heart size is within normal limits. Pulmonary vascularity reflects underlying emphysema. No adenopathy. There is  atherosclerotic change in the aorta. No bone lesions.  IMPRESSION: Emphysema with areas of scarring bilaterally. No appreciable edema or consolidation.   Electronically Signed   By: Lowella Grip M.D.   On: 09/05/2013 15:53   Dg Elbow Complete Left  09/05/2013   CLINICAL DATA:  Status post fall.  EXAM: LEFT ELBOW - COMPLETE 3+ VIEW  COMPARISON:  None.  FINDINGS: There is no evidence of fracture, dislocation, or joint effusion. There is no evidence of arthropathy or other focal bone abnormality. Soft tissues are unremarkable.  IMPRESSION: Negative exam.   Electronically Signed   By: Inge Rise M.D.   On: 09/05/2013 15:54   Dg Hip Complete Left  09/05/2013   CLINICAL DATA:  Status post fall.  Left hip pain.  EXAM: LEFT HIP - COMPLETE 2+ VIEW  COMPARISON:  None.  FINDINGS: The patient has an acute subcapital  fracture of the left hip. No other acute bony or joint abnormality is identified. Lower lumbar degenerative change is noted.  IMPRESSION: Acute subcapital fracture left hip.   Electronically Signed   By: Inge Rise M.D.   On: 09/05/2013 15:59   Dg Knee 1-2 Views Left  09/05/2013   CLINICAL DATA:  Pain.  EXAM: LEFT KNEE - 1-2 VIEW  COMPARISON:  None.  FINDINGS: Soft tissue structures are unremarkable. Mild tricompartment degenerative change. No evidence of fracture or dislocation .  IMPRESSION: Mild tricompartment degenerative change.  No acute abnormality.   Electronically Signed   By: Marcello Moores  Register   On: 09/05/2013 15:54   Dg Pelvis Portable  09/06/2013   CLINICAL DATA:  Postoperative  EXAM: PORTABLE PELVIS 1-2 VIEWS  COMPARISON:  Abdomen for 01/2005  FINDINGS: Interval left hip arthroplasty with cemented femoral component. Component appears well seated. Pelvis appears intact. No displaced fractures identified. Soft tissue gas consistent with recent surgery.  IMPRESSION: Left hip arthroplasty with component appearing well-seated.   Electronically Signed   By: Lucienne Capers M.D.    On: 09/06/2013 00:57   Dg Chest Port 1 View  09/08/2013   CLINICAL DATA:  80 year old female with leukocytosis status post surgery. Left hip arthroplasty. Initial encounter.  EXAM: PORTABLE CHEST - 1 VIEW  COMPARISON:  09/05/2013 and earlier.  FINDINGS: Portable AP semi upright view at 0944 hrs. Coarse bilateral pulmonary opacity. Stable lung volumes. Mildly increased left lung base opacity compared to prior Normal cardiac size and mediastinal contours. Visualized tracheal air column is within normal limits.  IMPRESSION: Chronic lung disease with superimposed patchy left lung base opacity suspicious for acute infectious exacerbation in this setting.   Electronically Signed   By: Lars Pinks M.D.   On: 09/08/2013 09:55    ECG  Atrial fibrillation with heart rate 140s  ASSESSMENT AND PLAN  1. Postop A-fib with RVR  - continue IV heparin  - obtain echocardiogram  - digoxin vs IV diltiazem, her hypotension may be partially caused by tachycardia  - maybe candidate for NOAC (denies any imbalance history, this was a mechanical fall where she tripped)  - unclear true onset as patient's last EKG on 8/27. Will observe in stepdown, hopefully with adequate rate control, she will autoconvert. However if she does not have significant valvular dx or dilated RV, she may be a candidate for cardioversion/TEE  2. Hypotension  - started today after onset of a-fib, losartan and amlodipine stopped  3. H/o HTN 4. HLD 5. Elevated d-dimer: not accurate given recent surgery 6. Possible pneumonia: on abx 7. Postop anemia: hgb 9.3  Hilbert Corrigan, [place] 09/08/2013, 3:59 PM

## 2013-09-08 NOTE — Consult Note (Signed)
The patient was seen and examined, and I agree with the assessment and plan as documented above, with modifications as noted below. 80 yr old woman s/p left hip surgery on 8/27 with a planned discharge for 8/31 who developed rapid atrial fibrillation/flutter earlier this afternoon, ECG HR 144 bpm. No prior h/o arrhythmia and entirely asymptomatic throughout episode. As I walked into the room to evaluate her, she spontaneously converted to normal sinus rhythm. BP 99/52, HR 80 bpm range. She is feeling well and receiving an IV fluid bolus. Cardiovascular exam unremarkable. Chest xray suspicious for left lower lobe opacity and is empirically being treated with antibiotics.  RECS: Obtain echocardiogram to assess LV systolic/diastolic function and LA size. No need for heparin at this point. Likely etiologies include a combination of post-op setting with anemia and possible hospital-acquired pneumonia. I do not recommend obtaining a CT angiogram, and fibrin degradation products (d-dimer) bound to be elevated post-operatively. Continue to hold amlodipine and losartan. Do not recommend initiating beta blockers/CCB's at this point, unless she has a recurrence. Hopefully, this is a self-limited episode and she will not require longterm treatment with anticoagulants. If her LA size is enlarged in the context of longstanding essential HTN, she would be at a higher likelihood for recurrence.

## 2013-09-08 NOTE — Progress Notes (Addendum)
Clinical Social Work Department CLINICAL SOCIAL WORK PLACEMENT NOTE 09/08/2013  Patient:  Alexa Middleton, Alexa Middleton  Account Number:  [id number] Admit date:  09/05/2013  Clinical Social Worker:  Sueanne Margarita, LCSW  Date/time:  09/07/2013 02:00 PM  Clinical Social Work is seeking post-discharge placement for this patient at the following level of care:   [place]   (*CSW will update this form in Epic as items are completed)   09/07/2013  Patient/family provided with [place] Department of Clinical Social Work's list of facilities offering this level of care within the geographic area requested by the patient (or if unable, by the patient's family).  09/07/2013  Patient/family informed of their freedom to choose among providers that offer the needed level of care, that participate in Medicare, Medicaid or managed care program needed by the patient, have an available bed and are willing to accept the patient.  09/07/2013  Patient/family informed of [facility]' ownership interest in [facility], as well as of the fact that they are under no obligation to receive care at this facility.  PASARR submitted to EDS on 09/08/2013 PASARR number received on 09/08/2013  FL2 transmitted to all facilities in geographic area requested by pt/family on  09/07/2013 FL2 transmitted to all facilities within larger geographic area on   Patient informed that his/her managed care company has contracts with or will negotiate with  certain facilities, including the following:     Patient/family informed of bed offers received:  09/09/2013 Patient chooses bed at  [facility] Physician recommends and patient chooses bed at    Patient to be transferred to  on   Patient to be transferred to facility by  Patient and family notified of transfer on  Name of family member notified:    The following physician request were entered in Epic:   Additional Comments:

## 2013-09-09 DIAGNOSIS — I10 Essential (primary) hypertension: Secondary | ICD-10-CM

## 2013-09-09 DIAGNOSIS — I4729 Other ventricular tachycardia: Secondary | ICD-10-CM

## 2013-09-09 DIAGNOSIS — E871 Hypo-osmolality and hyponatremia: Secondary | ICD-10-CM

## 2013-09-09 DIAGNOSIS — I4891 Unspecified atrial fibrillation: Secondary | ICD-10-CM | POA: Diagnosis present

## 2013-09-09 DIAGNOSIS — I472 Ventricular tachycardia, unspecified: Secondary | ICD-10-CM

## 2013-09-09 DIAGNOSIS — I059 Rheumatic mitral valve disease, unspecified: Secondary | ICD-10-CM

## 2013-09-09 DIAGNOSIS — R000 Tachycardia, unspecified: Secondary | ICD-10-CM | POA: Diagnosis not present

## 2013-09-09 LAB — BASIC METABOLIC PANEL
ANION GAP: 10 (ref 5–15)
BUN: 13 mg/dL (ref 6–23)
CO2: 28 mEq/L (ref 19–32)
Calcium: 7.7 mg/dL — ABNORMAL LOW (ref 8.4–10.5)
Chloride: 94 mEq/L — ABNORMAL LOW (ref 96–112)
Creatinine, Ser: 0.54 mg/dL (ref 0.50–1.10)
GFR, EST NON AFRICAN AMERICAN: 84 mL/min — AB (ref >90)
GLUCOSE: 105 mg/dL — AB (ref 70–99)
POTASSIUM: 4 meq/L (ref 3.7–5.3)
Sodium: 132 mEq/L — ABNORMAL LOW (ref 137–147)

## 2013-09-09 LAB — URINALYSIS, ROUTINE W REFLEX MICROSCOPIC
Bilirubin Urine: NEGATIVE
GLUCOSE, UA: NEGATIVE mg/dL
Ketones, ur: NEGATIVE mg/dL
Leukocytes, UA: NEGATIVE
Nitrite: NEGATIVE
Protein, ur: NEGATIVE mg/dL
SPECIFIC GRAVITY, URINE: 1.021 (ref 1.005–1.030)
Urobilinogen, UA: 0.2 mg/dL (ref 0.0–1.0)
pH: 6 (ref 5.0–8.0)

## 2013-09-09 LAB — CBC
HCT: 24.3 % — ABNORMAL LOW (ref 36.0–46.0)
Hemoglobin: 8.1 g/dL — ABNORMAL LOW (ref 12.0–15.0)
MCH: 28.9 pg (ref 26.0–34.0)
MCHC: 33.3 g/dL (ref 30.0–36.0)
MCV: 86.8 fL (ref 78.0–100.0)
PLATELETS: 250 10*3/uL (ref 150–400)
RBC: 2.8 MIL/uL — AB (ref 3.87–5.11)
RDW: 14.9 % (ref 11.5–15.5)
WBC: 11.7 10*3/uL — AB (ref 4.0–10.5)

## 2013-09-09 LAB — URINE MICROSCOPIC-ADD ON

## 2013-09-09 LAB — TROPONIN I

## 2013-09-09 LAB — TSH: TSH: 2.4 u[IU]/mL (ref 0.350–4.500)

## 2013-09-09 MED ORDER — AMIODARONE HCL IN DEXTROSE 360-4.14 MG/200ML-% IV SOLN
30.0000 mg/h | INTRAVENOUS | Status: DC
  Administered 2013-09-09 – 2013-09-10 (×2): 30 mg/h via INTRAVENOUS
  Filled 2013-09-09 (×2): qty 200

## 2013-09-09 MED ORDER — AMIODARONE LOAD VIA INFUSION
150.0000 mg | Freq: Once | INTRAVENOUS | Status: AC
  Administered 2013-09-09: 150 mg via INTRAVENOUS
  Filled 2013-09-09: qty 83.34

## 2013-09-09 MED ORDER — ENOXAPARIN SODIUM 40 MG/0.4ML ~~LOC~~ SOLN
40.0000 mg | SUBCUTANEOUS | Status: DC
  Administered 2013-09-09 – 2013-09-11 (×3): 40 mg via SUBCUTANEOUS
  Filled 2013-09-09 (×4): qty 0.4

## 2013-09-09 MED ORDER — AMIODARONE HCL IN DEXTROSE 360-4.14 MG/200ML-% IV SOLN
60.0000 mg/h | INTRAVENOUS | Status: AC
  Administered 2013-09-09 (×2): 60 mg/h via INTRAVENOUS
  Filled 2013-09-09 (×2): qty 200

## 2013-09-09 NOTE — Progress Notes (Signed)
CTSP secondary to onset of SVT with wide complex tachycardia.  Patient complains of palpitations and some mild dizziness with SBP mmHg with mild SOB.  EKG shows WCT at 159bpm with LBBB appearance.  She denies any chest pain.  Exam shows mild crackles at the left base otherwise clear and heart is tachycardic.  Rhythm is most likely atrial tachycardia or atrial flutter with 2:1 block and rate dependent BBB.  Will transfer to ICU and start Amiodarone 150mg  IV and then gtt. Hopefully she will convert back to NSR.  Would recommend changing to PO Amio at discharge for a few weeks post-op and then consider stopping.  Will check 2D echo to assess LVF and LA size.  Lytes are normal.

## 2013-09-09 NOTE — Progress Notes (Signed)
   Subjective: 4 Days Post-Op Procedure(s) (LRB): ARTHROPLASTY BIPOLAR HIP (Left) Patient reports pain as mild.   Patient seen in rounds for Dr. Wynelle Link. She was transferred to the ICU yesterday due to heart racing.  Routine vital signs checked, BP 92/44, P155.  EKG showed Afib.  Her rate improved and was transferred to the 4th Floor Telemetry for continued monitoring.  Undergoing workup at this time but felt likely due to postoperative surgical stress.  Resuming therapy and Lovenox today. Patient is well, and has had no acute complaints or problems.  Objective: Vital signs in last 24 hours: Temp:  [98.1 F (36.7 C)-99 F (37.2 C)] 98.2 F (36.8 C) (08/31 1012) Pulse Rate:  [79-155] 104 (08/31 1012) Resp:  [16-30] 18 (08/31 1012) BP: (92-134)/(28-96) 124/47 mmHg (08/31 1012) SpO2:  [91 %-100 %] 96 % (08/31 1012) Weight:  [58.4 kg (128 lb 12 oz)] 58.4 kg (128 lb 12 oz) (08/30 1627)  Intake/Output from previous day:  Intake/Output Summary (Last 24 hours) at 09/09/13 1044 Last data filed at 09/09/13 0855  Gross per 24 hour  Intake 1771.75 ml  Output    850 ml  Net 921.75 ml    Intake/Output this shift: Total I/O In: 338.8 [P.O.:120; I.V.:218.8] Out: -   Labs:  Recent Labs  09/07/13 0442 09/08/13 0359 09/09/13 0232  HGB 9.8* 9.3* 8.1*    Recent Labs  09/08/13 0359 09/09/13 0232  WBC 13.8* 11.7*  RBC 3.18* 2.80*  HCT 27.6* 24.3*  PLT 260 250    Recent Labs  09/08/13 1454 09/09/13 0232  NA 128* 132*  K 4.2 4.0  CL 90* 94*  CO2 26 28  BUN 16 13  CREATININE 0.56 0.54  GLUCOSE 172* 105*  CALCIUM 7.9* 7.7*    Recent Labs  09/08/13 1454  INR 1.03    EXAM General - Patient is Alert, Appropriate and Oriented Extremity - Neurovascular intact Sensation intact distally Dorsiflexion/Plantar flexion intact Dressing/Incision - clean, dry, no drainage, healing Motor Function - intact, moving foot and toes well on exam.   Past Medical History  Diagnosis  Date  . Cancer     skin cancer to left leg  . Hypertension     Assessment/Plan: 4 Days Post-Op Procedure(s) (LRB): ARTHROPLASTY BIPOLAR HIP (Left) Principal Problem:   Subcapital fracture of left hip Active Problems:   Hip fracture   HTN (hypertension)   Hyperlipidemia   Hyponatremia   Leukocytosis, unspecified   Postoperative anemia due to acute blood loss   Aspiration pneumonia   Atrial fibrillation with RVR  Estimated body mass index is 23.54 kg/(m^2) as calculated from the following:   Height as of this encounter: 5\' 2"  (1.575 m).   Weight as of this encounter: 58.4 kg (128 lb 12 oz). Up with therapy  DVT Prophylaxis - Lovenox Weight Bearing As Tolerated left Leg  Arlee Muslim, PA-C Orthopaedic Surgery 09/09/2013, 10:44 AM

## 2013-09-09 NOTE — Progress Notes (Signed)
Pt. Having 3 runs of PVC's (5-8 beats). Pt. Asymptomatic. Resting in bed.  MD paged and made aware. EKG performed, results in epic.   Will continue to monitor pt.

## 2013-09-09 NOTE — Progress Notes (Signed)
PT Cancellation Note  Patient Details Name: Alexa Middleton MRN: [id number] DOB: [birth year]   Cancelled Treatment:    Reason Eval/Treat Not Completed: Medical issues which prohibited therapy--RN requested PT be held at this time due to increased/irregular HR. Will check back on tomorrow.   Weston Anna [facility] 09/09/2013, 1:31 PM

## 2013-09-09 NOTE — Progress Notes (Signed)
CARE MANAGEMENT NOTE 09/09/2013  Patient:  Alexa Middleton, Alexa Middleton   Account Number:  [id number]  Date Initiated:  09/06/2013  Documentation initiated by:  Dena Esperanza  Subjective/Objective Assessment:   patient sustained fall at home and resulted in fracture of the left hip requiring surgical intervention.     Action/Plan:   home with hhc and family support/patient lives at home with spouse and has been indep. in adls and activites   Anticipated DC Date:  09/12/2013   Anticipated DC Plan:  SKILLED NURSING FACILITY  In-house referral  Clinical Social Worker      DC Planning Services  CM consult      [facility] Choice  NA   Choice offered to / List presented to:  C-1 Patient   DME arranged  [place]      DME agency  [place].        Status of service:  Completed, signed off Medicare Important Message given?  YES (If response is "NO", the following Medicare IM given date fields will be blank) Date Medicare IM given:  09/08/2013 Medicare IM given by:  [facility] Date Additional Medicare IM given:   Additional Medicare IM given by:    Discharge Disposition:  [place]  Per UR Regulation:  Reviewed for med. necessity/level of care/duration of stay  If discussed at [place] of Stay Meetings, dates discussed:    Comments:  [id number]/Lindee Leason,RN,BSN,CCM: patient went into a.fib transferred to sdu and placed on iv amiodarone drip.  09/08/2013 Pt has RW and 3n1 in the room. Will have family take home. Plan is for dc to SNF -rehab. [name] RN CCM Case Mgmt phone [phone]  [facility]

## 2013-09-09 NOTE — Progress Notes (Signed)
CSW continuing to follow for disposition planning.  CSW met with pt at bedside. CSW introduced self and explained role. CSW provided SNF bed offers. Pt shared that pt husband is out touring SNF facilities at this time. Pt request CSW contact pt husband via telephone.  CSW contacted pt husband via telephone. CSW introduced self and explained role. Pt husband stated that he is still touring facilities. CSW shared bed offers with pt husband via telephone. Pt husband states that he plans to come back to hospital to discuss tours with pt wife in order to make decision. Pt husband states that he will contacted this CSW once decision made.   Pt clinicals were faxed to [facility] on 8/30. Awaiting auth.   CSW to continue to follow to assist with pt disposition planning.  Alison Murray, MSW, [place] [facility] [phone]

## 2013-09-09 NOTE — Progress Notes (Signed)
  Echocardiogram 2D Echocardiogram has been performed.  Donata Clay 09/09/2013, 4:28 PM

## 2013-09-09 NOTE — Progress Notes (Signed)
Called because of recurrent tachycardia on telemetry.  Patient suddenly converted to wide complex and started feeling like her breathing was heavy and a little flushed.  Denies chest pain, nausea, lightheadedness.  BP 98/70.  Cardiology consulted.  Transfer back to stepdown for amiodarone.

## 2013-09-09 NOTE — Progress Notes (Signed)
[facility] PROGRESS NOTE  Alexa Middleton IRJ:[id number] DOB: 02-21-1927 DOA: 09/05/2013 PCP: No primary provider on file.  Assessment/Plan  Subcapital fracture of left hip s/p left hip hemiarthoplasty on 8/27 by Dr. Wynelle Link -  Resume PT/OT -  Restart lovenox this AM -  Continue stool softeners and pain medication -  Plan for SNF for rehab possibly tomorrow if no further arrhythmias on tele and if ECHO normal  Acute onset a-fib with RVR, spontaneously reverted to NSR -  Continue telemetry -  F/u ECHO -  Troponins negative -  TSH pending -  CT angio negative for PE  Possible pneumonia, although afebrile.  CT chest was negative for pneumonia -  D/c augmentin  HTN (hypertension), blood pressure still low, particularly diastolic  -  Continue to hold losartan and Amlodipine  -  Given IVF overnight  Hyperlipidemia, stable, continue statin  Hyponatremia, improved with IVF -  D/c IVF -  Diet now advanced  Mild hyperkalemia likely due to hemolysis and resolved spontaneously  Mild leukocytosis, trending down -  CXR:  Possible LLL pneumonia, however CT angio negative  -  UA pending  Acute blood loss anemia due to surgery, hemoglobin decreased again today -  asymptomatic -  Repeat in AM  Hyperglycemia, likely stress-related from surgery.  A1c 6.4.   Diet:  regular Access:  PIV IVF:  OFF Proph:  lovenox  Code Status: full Family Communication: patient alone Disposition Plan:   To SNF for rehab in 1-2 days  Consultants:  Orthopedics, Dr. Wynelle Link  Cardiology, Dr. Martinique  Procedures:  Left hip hemiarthroplasty on 8/27   CXR   XR knee  XR left elbow  XR left hip  CT angio chest  Antibiotics:  Cefazolin perioperatively    Augmentin 8/30 >> 8/31  HPI/Subjective:  Episode of a-fib or a-flutter with RVR yesterday associated with hypotension.  Resolved spontaneously.   Feeling well this morning and would like to resume rehab because her hip is becoming  stiff  Objective: Filed Vitals:   09/09/13 0000 09/09/13 0200 09/09/13 0400 09/09/13 0600  BP: 96/29 104/36 129/37 114/35  Pulse: 82 84 91 90  Temp: 99 F (37.2 C)  98.5 F (36.9 C)   TempSrc: Oral  Oral   Resp: 19 21 16 18   Height:      Weight:      SpO2: 92% 99% 99% 91%    Intake/Output Summary (Last 24 hours) at 09/09/13 0804 Last data filed at 09/09/13 0600  Gross per 24 hour  Intake   1673 ml  Output   1150 ml  Net    523 ml   Filed Weights   09/05/13 1711 09/08/13 1627  Weight: 53.071 kg (117 lb) 58.4 kg (128 lb 12 oz)    Exam:   General:  WF, No acute distress  HEENT:  NCAT, MMM  Cardiovascular:  RRR, nl S1, S2 no mrg, 2+ pulses, warm extremities  Respiratory:  Some rales anterior right chest and diminished throughout, mild wheeze, no rhonchi, no increased WOB  Abdomen:   NABS, soft, NT/ND  MSK:   Normal tone and bulk, no LEE.  Left hip dressing c/d/i except for one area on more distal dressing with dried blood, no swelling or ecchymosis  Neuro:  Grossly intact  Data Reviewed: Basic Metabolic Panel:  Recent Labs Lab 09/05/13 1413 09/06/13 0457 09/07/13 0442 09/08/13 1454 09/09/13 0232  NA 131* 131* 137 128* 132*  K 4.2 5.4* 4.4 4.2 4.0  CL  92* 96 99 90* 94*  CO2 26 26 28 26 28   GLUCOSE 191* 156* 133* 172* 105*  BUN 12 9 13 16 13   CREATININE 0.56 0.51 0.50 0.56 0.54  CALCIUM 8.9 7.9* 8.5 7.9* 7.7*  MG  --   --   --  2.0  --   PHOS  --   --   --  3.1  --    Liver Function Tests: No results found for this basename: AST, ALT, ALKPHOS, BILITOT, PROT, ALBUMIN,  in the last 168 hours No results found for this basename: LIPASE, AMYLASE,  in the last 168 hours No results found for this basename: AMMONIA,  in the last 168 hours CBC:  Recent Labs Lab 09/05/13 1413 09/06/13 0457 09/07/13 0442 09/08/13 0359 09/09/13 0232  WBC 10.1 11.1* 12.6* 13.8* 11.7*  NEUTROABS 8.4*  --   --   --   --   HGB 13.3 11.8* 9.8* 9.3* 8.1*  HCT 39.5 35.8*  29.7* 27.6* 24.3*  MCV 86.8 89.1 88.7 86.8 86.8  PLT 308 249 256 260 250   Cardiac Enzymes:  Recent Labs Lab 09/08/13 1454 09/08/13 2009 09/09/13 0232  TROPONINI <0.30 <0.30 <0.30   BNP (last 3 results) No results found for this basename: PROBNP,  in the last 8760 hours CBG: No results found for this basename: GLUCAP,  in the last 168 hours  Recent Results (from the past 240 hour(s))  SURGICAL PCR SCREEN     Status: None   Collection Time    09/05/13  5:58 PM      Result Value Ref Range Status   MRSA, PCR NEGATIVE  NEGATIVE Final   Staphylococcus aureus NEGATIVE  NEGATIVE Final   Comment:            The Xpert SA Assay (FDA     approved for NASAL specimens     in patients over 76 years of age),     is one component of     a comprehensive surveillance     program.  Test performance has     been validated by [employer] for patients greater     than or equal to 71 year old.     It is not intended     to diagnose infection nor to     guide or monitor treatment.  MRSA PCR SCREENING     Status: None   Collection Time    09/08/13  4:52 PM      Result Value Ref Range Status   MRSA by PCR NEGATIVE  NEGATIVE Final   Comment:            The GeneXpert MRSA Assay (FDA     approved for NASAL specimens     only), is one component of a     comprehensive MRSA colonization     surveillance program. It is not     intended to diagnose MRSA     infection nor to guide or     monitor treatment for     MRSA infections.     Studies: Ct Angio Chest Pe W/cm &/or Wo Cm  09/08/2013   CLINICAL DATA:  Elevated D-dimer. Hypotension, tachycardia. Atrial fib.  EXAM: CT ANGIOGRAPHY CHEST WITH CONTRAST  TECHNIQUE: Multidetector CT imaging of the chest was performed using the standard protocol during bolus administration of intravenous contrast. Multiplanar CT image reconstructions and MIPs were obtained to evaluate the vascular anatomy.  CONTRAST:  119mL  OMNIPAQUE IOHEXOL 350 MG/ML SOLN   COMPARISON:  Chest x-ray earlier today.  FINDINGS: No filling defects in the pulmonary arteries to suggest pulmonary emboli. Small bilateral pleural effusions. Heart is normal size. Aorta is normal caliber. Coronary artery and aortic calcifications noted. No mediastinal, hilar, or axillary adenopathy.  Moderate paraseptal emphysema. Peripheral subpleural reticulation and interstitial thickening compatible with fibrosis, most pronounced in the lung bases. Airspace opacity at the left base likely related to scarring. No other confluent airspace opacities.  Imaging into the upper abdomen shows no acute findings.  No acute bony abnormality.  Review of the MIP images confirms the above findings.  IMPRESSION: COPD, fibrosis.  Opacity at the left lung base likely related to scarring/ fibrosis.  Small bilateral effusions.   Electronically Signed   By: Rolm Baptise M.D.   On: 09/08/2013 18:35   Dg Chest Port 1 View  09/08/2013   CLINICAL DATA:  80 year old female with leukocytosis status post surgery. Left hip arthroplasty. Initial encounter.  EXAM: PORTABLE CHEST - 1 VIEW  COMPARISON:  09/05/2013 and earlier.  FINDINGS: Portable AP semi upright view at 0944 hrs. Coarse bilateral pulmonary opacity. Stable lung volumes. Mildly increased left lung base opacity compared to prior Normal cardiac size and mediastinal contours. Visualized tracheal air column is within normal limits.  IMPRESSION: Chronic lung disease with superimposed patchy left lung base opacity suspicious for acute infectious exacerbation in this setting.   Electronically Signed   By: Lars Pinks M.D.   On: 09/08/2013 09:55    Scheduled Meds: . amoxicillin-clavulanate  1 tablet Oral Q12H  . atorvastatin  10 mg Oral QHS  . digoxin  0.25 mg Intravenous Once  . docusate sodium  100 mg Oral BID  . senna  2 tablet Oral QHS  . sodium chloride  500 mL Intravenous Once  . sodium chloride  3 mL Intravenous Q12H   Continuous Infusions: . sodium chloride 75  mL/hr at 09/08/13 1656    Principal Problem:   Subcapital fracture of left hip Active Problems:   Hip fracture   HTN (hypertension)   Hyperlipidemia   Hyponatremia   Leukocytosis, unspecified   Postoperative anemia due to acute blood loss   Aspiration pneumonia    Time spent: 30 min    Keyira Mondesir, [facility] Pager [phone]. If 7PM-7AM, please contact night-coverage at [email or web address], password [name] 09/09/2013, 8:04 AM  LOS: 4 days

## 2013-09-09 NOTE — Progress Notes (Signed)
TELEMETRY: Reviewed telemetry pt in NSR with PACs. Short run of ectopic atrial tachycardia. No Afib.: Filed Vitals:   09/09/13 0000 09/09/13 0200 09/09/13 0400 09/09/13 0600  BP: 96/29 104/36 129/37 114/35  Pulse: 82 84 91 90  Temp: 99 F (37.2 C)  98.5 F (36.9 C)   TempSrc: Oral  Oral   Resp: 19 21 16 18   Height:      Weight:      SpO2: 92% 99% 99% 91%    Intake/Output Summary (Last 24 hours) at 09/09/13 0735 Last data filed at 09/09/13 0600  Gross per 24 hour  Intake   1673 ml  Output   1150 ml  Net    523 ml   Filed Weights   09/05/13 1711 09/08/13 1627  Weight: 117 lb (53.071 kg) 128 lb 12 oz (58.4 kg)    Subjective Doesn't feel well this am but no specific complaints. Denies chest pain, SOB, or cough.  Marland Kitchen amoxicillin-clavulanate  1 tablet Oral Q12H  . atorvastatin  10 mg Oral QHS  . digoxin  0.25 mg Intravenous Once  . docusate sodium  100 mg Oral BID  . senna  2 tablet Oral QHS  . sodium chloride  500 mL Intravenous Once  . sodium chloride  3 mL Intravenous Q12H   . sodium chloride 75 mL/hr at 09/08/13 1656    LABS: Basic Metabolic Panel:  Recent Labs  09/07/13 0442 09/08/13 1454 09/09/13 0232  NA 137 128* 132*  K 4.4 4.2 4.0  CL 99 90* 94*  CO2 28 26 28   GLUCOSE 133* 172* 105*  BUN 13 16 13   CREATININE 0.50 0.56 0.54  CALCIUM 8.5 7.9* 7.7*  MG  --  2.0  --   PHOS  --  3.1  --    Liver Function Tests: No results found for this basename: AST, ALT, ALKPHOS, BILITOT, PROT, ALBUMIN,  in the last 72 hours No results found for this basename: LIPASE, AMYLASE,  in the last 72 hours CBC:  Recent Labs  09/08/13 0359 09/09/13 0232  WBC 13.8* 11.7*  HGB 9.3* 8.1*  HCT 27.6* 24.3*  MCV 86.8 86.8  PLT 260 250   Cardiac Enzymes:  Recent Labs  09/08/13 1454 09/08/13 2009 09/09/13 0232  TROPONINI <0.30 <0.30 <0.30   BNP: No results found for this basename: PROBNP,  in the last 72 hours D-Dimer:  Recent Labs  09/08/13 1454  DDIMER  1.64*   Hemoglobin A1C:  Recent Labs  09/07/13 0442  HGBA1C 6.4*   Fasting Lipid Panel: No results found for this basename: CHOL, HDL, LDLCALC, TRIG, CHOLHDL, LDLDIRECT,  in the last 72 hours Thyroid Function Tests: No results found for this basename: TSH, T4TOTAL, FREET3, T3FREE, THYROIDAB,  in the last 72 hours   Radiology/Studies:  Ct Angio Chest Pe W/cm &/or Wo Cm  09/08/2013   CLINICAL DATA:  Elevated D-dimer. Hypotension, tachycardia. Atrial fib.  EXAM: CT ANGIOGRAPHY CHEST WITH CONTRAST  TECHNIQUE: Multidetector CT imaging of the chest was performed using the standard protocol during bolus administration of intravenous contrast. Multiplanar CT image reconstructions and MIPs were obtained to evaluate the vascular anatomy.  CONTRAST:  147mL OMNIPAQUE IOHEXOL 350 MG/ML SOLN  COMPARISON:  Chest x-ray earlier today.  FINDINGS: No filling defects in the pulmonary arteries to suggest pulmonary emboli. Small bilateral pleural effusions. Heart is normal size. Aorta is normal caliber. Coronary artery and aortic calcifications noted. No mediastinal, hilar, or axillary adenopathy.  Moderate paraseptal emphysema. Peripheral  subpleural reticulation and interstitial thickening compatible with fibrosis, most pronounced in the lung bases. Airspace opacity at the left base likely related to scarring. No other confluent airspace opacities.  Imaging into the upper abdomen shows no acute findings.  No acute bony abnormality.  Review of the MIP images confirms the above findings.  IMPRESSION: COPD, fibrosis.  Opacity at the left lung base likely related to scarring/ fibrosis.  Small bilateral effusions.   Electronically Signed   By: Rolm Baptise M.D.   On: 09/08/2013 18:35   Dg Chest Port 1 View  09/08/2013   CLINICAL DATA:  80 year old female with leukocytosis status post surgery. Left hip arthroplasty. Initial encounter.  EXAM: PORTABLE CHEST - 1 VIEW  COMPARISON:  09/05/2013 and earlier.  FINDINGS: Portable  AP semi upright view at 0944 hrs. Coarse bilateral pulmonary opacity. Stable lung volumes. Mildly increased left lung base opacity compared to prior Normal cardiac size and mediastinal contours. Visualized tracheal air column is within normal limits.  IMPRESSION: Chronic lung disease with superimposed patchy left lung base opacity suspicious for acute infectious exacerbation in this setting.   Electronically Signed   By: Lars Pinks M.D.   On: 09/08/2013 09:55   Echo: pending  PHYSICAL EXAM General: Well developed, well nourished, in no acute distress. Head: Normocephalic, atraumatic, sclera non-icteric, oropharynx is clear Neck: Negative for carotid bruits. JVD not elevated. No adenopathy Lungs: Clear bilaterally to auscultation without wheezes, rales, or rhonchi. Breathing is unlabored. Heart: RRR S1 S2 without murmurs, rubs, or gallops.  Abdomen: Soft, non-tender, non-distended with normoactive bowel sounds.  No obvious abdominal masses. Extremities: No  edema.  Distal pedal pulses are 2+ and equal bilaterally. Neuro: Alert and oriented X 3. Moves all extremities spontaneously.   ASSESSMENT AND PLAN: 1. Paroxysmal Afib. No recurrence on telemetry. Brief run of ectopic atrial tachycardia. Arrhythmia probably related to stress of recent hip fracture/surgery and anemia. No indication for anticoagulation at this point. Echo pending today. Ok to proceed with Rehab therapies and we will monitor on telemetry. 2. Hypotension. Resolved. Amlodipine and losartan held. Hydrated.  3. Hyponatremia. Improved with IV NS. 4. Left hip fracture s/p surgery. 5. COPD/emphysema. No evidence of PNA on CT. Fibrosis. No evidence of PE. 6. Post Op anemia. 7. History of HTN.  OK to progress with Rehab therapies. Will check Echo today. Monitor.  Present on Admission:  . Subcapital fracture of left hip . Hip fracture . HTN (hypertension) . Hyperlipidemia  Signed, Avina Eberle [place]ique, [place] 09/09/2013 7:35  AM

## 2013-09-09 NOTE — Progress Notes (Addendum)
ANTICOAGULATION CONSULT NOTE - Initial Consult  Pharmacy Consult for enoxaparin Indication: VTE prophylaxis  No Known Allergies  Patient Measurements: Height: 5\' 2"  (157.5 cm) Weight: 128 lb 12 oz (58.4 kg) IBW/kg (Calculated) : 50.1 Heparin Dosing Weight:   Vital Signs: Temp: 98.5 F (36.9 C) (08/31 0400) Temp src: Oral (08/31 0400) BP: 114/35 mmHg (08/31 0600) Pulse Rate: 90 (08/31 0600)  Labs:  Recent Labs  09/07/13 0442 09/08/13 0359 09/08/13 1454 09/08/13 2009 09/09/13 0232  HGB 9.8* 9.3*  --   --  8.1*  HCT 29.7* 27.6*  --   --  24.3*  PLT 256 260  --   --  250  APTT  --   --  36  --   --   LABPROT  --   --  13.5  --   --   INR  --   --  1.03  --   --   CREATININE 0.50  --  0.56  --  0.54  TROPONINI  --   --  <0.30 <0.30 <0.30    Estimated Creatinine Clearance: 41.4 ml/min (by C-G formula based on Cr of 0.54).   Medical History: Past Medical History  Diagnosis Date  . Cancer     skin cancer to left leg  . Hypertension    Assessment: 27 YOF admitted for hip fracture (s/p repair 8/27), she was on enoxaparin 40mg  SQ q24h but was stopped in lieu of orders to start heparin gtt 8/30 for Afib, patient converted to NSR upon transfer to ICU and heparin gtt was cancelled prior to initiating.  Orders for pharmacy to restart enoxaparin for VTE prophylaxis.    CBC: Hgb = 8.1, pltc WNL  Renal: SCr WNL with est CrCL> 16ml/min  Weight = 58kg  Last dose enoxaparin ~noon 8/30  Goal of Therapy:   Dose for renal function   Plan:   Enoxaparin 40mg  SQ q24h s/p hip fracture surgery  Pharmacy to follow at distance as do not anticipate dose change but will intervene if needed  Doreene Eland, PharmD, BCPS.   Pager: [phone]  09/09/2013,8:22 AM

## 2013-09-09 NOTE — Progress Notes (Signed)
Per monitor tech pt converted to A-fib/flutter and then back to sinus tach.  Pt. Asymptomatic throughout. Pt. Then converted to questionable sustained V-tach (rate 140's-170's).  MD made aware. Second EKG performed. Cardiology at beside.  Awaiting transfer orders.

## 2013-09-10 DIAGNOSIS — I4892 Unspecified atrial flutter: Secondary | ICD-10-CM

## 2013-09-10 LAB — CBC
HCT: 24.9 % — ABNORMAL LOW (ref 36.0–46.0)
Hemoglobin: 8.4 g/dL — ABNORMAL LOW (ref 12.0–15.0)
MCH: 29.5 pg (ref 26.0–34.0)
MCHC: 33.7 g/dL (ref 30.0–36.0)
MCV: 87.4 fL (ref 78.0–100.0)
Platelets: 275 10*3/uL (ref 150–400)
RBC: 2.85 MIL/uL — ABNORMAL LOW (ref 3.87–5.11)
RDW: 14.8 % (ref 11.5–15.5)
WBC: 10.4 10*3/uL (ref 4.0–10.5)

## 2013-09-10 LAB — BASIC METABOLIC PANEL
ANION GAP: 9 (ref 5–15)
BUN: 10 mg/dL (ref 6–23)
CALCIUM: 7.5 mg/dL — AB (ref 8.4–10.5)
CO2: 28 meq/L (ref 19–32)
CREATININE: 0.52 mg/dL (ref 0.50–1.10)
Chloride: 94 mEq/L — ABNORMAL LOW (ref 96–112)
GFR calc Af Amer: >90 mL/min (ref >90)
GFR calc non Af Amer: 85 mL/min — ABNORMAL LOW (ref >90)
Glucose, Bld: 112 mg/dL — ABNORMAL HIGH (ref 70–99)
Potassium: 3.8 mEq/L (ref 3.7–5.3)
Sodium: 131 mEq/L — ABNORMAL LOW (ref 137–147)

## 2013-09-10 MED ORDER — AMIODARONE HCL 200 MG PO TABS
400.0000 mg | ORAL_TABLET | Freq: Two times a day (BID) | ORAL | Status: DC
  Administered 2013-09-10 – 2013-09-13 (×7): 400 mg via ORAL
  Filled 2013-09-10 (×8): qty 2

## 2013-09-10 NOTE — Progress Notes (Signed)
TELEMETRY: Reviewed telemetry pt in NSR with PACs. Last run of atrial flutter at 17:41 yesterday  Filed Vitals:   09/10/13 0300 09/10/13 0400 09/10/13 0500 09/10/13 0540  BP: 111/31 103/28 121/94   Pulse: 69 66 79   Temp:      TempSrc:      Resp: 20 20 21    Height:      Weight:    131 lb 6.3 oz (59.6 kg)  SpO2: 98% 97% 99%     Intake/Output Summary (Last 24 hours) at 09/10/13 0609 Last data filed at 09/10/13 0400  Gross per 24 hour  Intake 987.03 ml  Output      0 ml  Net 987.03 ml   Filed Weights   09/05/13 1711 09/08/13 1627 09/10/13 0540  Weight: 117 lb (53.071 kg) 128 lb 12 oz (58.4 kg) 131 lb 6.3 oz (59.6 kg)    Subjective Denies chest pain, SOB, or cough.  Marland Kitchen atorvastatin  10 mg Oral QHS  . docusate sodium  100 mg Oral BID  . enoxaparin (LOVENOX) injection  40 mg Subcutaneous Q24H  . senna  2 tablet Oral QHS  . sodium chloride  500 mL Intravenous Once  . sodium chloride  3 mL Intravenous Q12H   . amiodarone 30 mg/hr (09/10/13 0400)    LABS: Basic Metabolic Panel:  Recent Labs  09/08/13 1454 09/09/13 0232 09/10/13 0336  NA 128* 132* 131*  K 4.2 4.0 3.8  CL 90* 94* 94*  CO2 26 28 28   GLUCOSE 172* 105* 112*  BUN 16 13 10   CREATININE 0.56 0.54 0.52  CALCIUM 7.9* 7.7* 7.5*  MG 2.0  --   --   PHOS 3.1  --   --    Liver Function Tests: No results found for this basename: AST, ALT, ALKPHOS, BILITOT, PROT, ALBUMIN,  in the last 72 hours No results found for this basename: LIPASE, AMYLASE,  in the last 72 hours CBC:  Recent Labs  09/09/13 0232 09/10/13 0336  WBC 11.7* 10.4  HGB 8.1* 8.4*  HCT 24.3* 24.9*  MCV 86.8 87.4  PLT 250 275   Cardiac Enzymes:  Recent Labs  09/08/13 1454 09/08/13 2009 09/09/13 0232  TROPONINI <0.30 <0.30 <0.30   BNP: No results found for this basename: PROBNP,  in the last 72 hours D-Dimer:  Recent Labs  09/08/13 1454  DDIMER 1.64*   Hemoglobin A1C: No results found for this basename: HGBA1C,  in the  last 72 hours Fasting Lipid Panel: No results found for this basename: CHOL, HDL, LDLCALC, TRIG, CHOLHDL, LDLDIRECT,  in the last 72 hours Thyroid Function Tests:  Recent Labs  09/09/13 0232  TSH 2.400     Radiology/Studies:  Ct Angio Chest Pe W/cm &/or Wo Cm  09/08/2013   CLINICAL DATA:  Elevated D-dimer. Hypotension, tachycardia. Atrial fib.  EXAM: CT ANGIOGRAPHY CHEST WITH CONTRAST  TECHNIQUE: Multidetector CT imaging of the chest was performed using the standard protocol during bolus administration of intravenous contrast. Multiplanar CT image reconstructions and MIPs were obtained to evaluate the vascular anatomy.  CONTRAST:  164mL OMNIPAQUE IOHEXOL 350 MG/ML SOLN  COMPARISON:  Chest x-ray earlier today.  FINDINGS: No filling defects in the pulmonary arteries to suggest pulmonary emboli. Small bilateral pleural effusions. Heart is normal size. Aorta is normal caliber. Coronary artery and aortic calcifications noted. No mediastinal, hilar, or axillary adenopathy.  Moderate paraseptal emphysema. Peripheral subpleural reticulation and interstitial thickening compatible with fibrosis, most pronounced in the lung bases. Airspace  opacity at the left base likely related to scarring. No other confluent airspace opacities.  Imaging into the upper abdomen shows no acute findings.  No acute bony abnormality.  Review of the MIP images confirms the above findings.  IMPRESSION: COPD, fibrosis.  Opacity at the left lung base likely related to scarring/ fibrosis.  Small bilateral effusions.   Electronically Signed   By: Rolm Baptise M.D.   On: 09/08/2013 18:35   Dg Chest Port 1 View  09/08/2013   CLINICAL DATA:  80 year old female with leukocytosis status post surgery. Left hip arthroplasty. Initial encounter.  EXAM: PORTABLE CHEST - 1 VIEW  COMPARISON:  09/05/2013 and earlier.  FINDINGS: Portable AP semi upright view at 0944 hrs. Coarse bilateral pulmonary opacity. Stable lung volumes. Mildly increased left  lung base opacity compared to prior Normal cardiac size and mediastinal contours. Visualized tracheal air column is within normal limits.  IMPRESSION: Chronic lung disease with superimposed patchy left lung base opacity suspicious for acute infectious exacerbation in this setting.   Electronically Signed   By: Lars Pinks M.D.   On: 09/08/2013 09:55   Echo: Study Conclusions  - Left ventricle: The cavity size was normal. Systolic function was normal. The estimated ejection fraction was in the range of 60% to 65%. Wall motion was normal; there were no regional wall motion abnormalities. Doppler parameters are consistent with abnormal left ventricular relaxation (grade 1 diastolic dysfunction). Doppler parameters are consistent with elevated ventricular end-diastolic filling pressure. - Aortic valve: Trileaflet; mildly thickened, mildly calcified leaflets. There was no regurgitation. - Aortic root: The aortic root was normal in size. - Mitral valve: Structurally normal valve. There was mild regurgitation. - Right ventricle: Systolic function was normal. - Right atrium: The atrium was normal in size. - Pulmonic valve: There was mild regurgitation. - Pulmonary arteries: Systolic pressure was within the normal range. - Inferior vena cava: The vessel was normal in size. - Pericardium, extracardiac: There was no pericardial effusion.    PHYSICAL EXAM General: Well developed, well nourished, in no acute distress. Head: Normocephalic, atraumatic, sclera non-icteric, oropharynx is clear Neck: Negative for carotid bruits. JVD not elevated. No adenopathy Lungs: Clear bilaterally to auscultation without wheezes, rales, or rhonchi. Breathing is unlabored. Heart: RRR S1 S2 without murmurs, rubs, or gallops.  Abdomen: Soft, non-tender, non-distended with normoactive bowel sounds.  No obvious abdominal masses. Extremities: No  edema.  Distal pedal pulses are 2+ and equal bilaterally. Neuro: Alert and  oriented X 3. Moves all extremities spontaneously.   ASSESSMENT AND PLAN: 1. Paroxysmal Afib/flutter. Developed atrial flutter yesterday afternoon with LBBB aberrancy. Resolved. Now on IV amiodarone.  Arrhythmia probably related to stress of recent hip fracture/surgery and anemia. Echo shows normal LV function and normal atrial size. Will switch to po amiodarone today. 2. Hypotension. Resolved. Amlodipine and losartan held. Hydrated.  3. Hyponatremia. Improved with IV NS. 4. Left hip fracture s/p surgery. 5. COPD/emphysema. No evidence of PNA on CT. Fibrosis. No evidence of PE. 6. Post Op anemia. 7. History of HTN.  OK to progress with Rehab therapies. Will check Echo today. Monitor.  Present on Admission:  . Subcapital fracture of left hip . Hip fracture . HTN (hypertension) . Hyperlipidemia  Signed, Gloyd Happ [place], [place] 09/10/2013 6:09 AM

## 2013-09-10 NOTE — Progress Notes (Addendum)
Physical Therapy Treatment Patient Details Name: Alexa Middleton MRN: [id number] DOB: January 05, 1928 Today's Date: 09/10/2013    History of Present Illness 80 yo female adm after fall resulting in L hip fx, s/p L hip hemiarthroplasty. New onset Afib/Aflutter.  PMHx: HTN    PT Comments    Progressing slowly with mobility. Continue to recommend SNF  Follow Up Recommendations  SNF     Equipment Recommendations  Rolling walker with 5" wheels    Recommendations for Other Services       Precautions / Restrictions Precautions Precautions: Posterior Hip;Fall Reviewed hip precautions. Pt unable to recall.  Restrictions Weight Bearing Restrictions: No LLE Weight Bearing: Weight bearing as tolerated    Mobility  Bed Mobility               General bed mobility comments: p  Transfers Overall transfer level: Needs assistance Equipment used: Rolling walker (2 wheeled) Transfers: Sit to/from Stand Sit to Stand: Mod assist         General transfer comment: assist to rise, stabilize, control descent. VCs safety, technique, hand/L LE placement. Increased time.   Ambulation/Gait Ambulation/Gait assistance: Min assist Ambulation Distance (Feet): 25 Feet Assistive device: Rolling walker (2 wheeled) Gait Pattern/deviations: Step-to pattern;Decreased stride length;Decreased step length - left;Decreased step length - right     General Gait Details: assist to stabilize. followed with recliner. VCS safety, technique, sequence. pt fatigues fairly easily. Ambulated on 2L O2.    Stairs            Wheelchair Mobility    Modified Rankin (Stroke Patients Only)       Balance                                    Cognition Arousal/Alertness: Awake/alert Behavior During Therapy: WFL for tasks assessed/performed Overall Cognitive Status: Within Functional Limits for tasks assessed                      Exercises General Exercises - Lower Extremity Ankle  Circles/Pumps: AROM;Both;10 reps;Seated Quad Sets: AROM;Both;10 reps;Seated Heel Slides: AAROM;Left;10 reps;Seated Hip ABduction/ADduction: AAROM;Left;10 reps;Seated    General Comments        Pertinent Vitals/Pain Pain Assessment: 0-10 Pain Score: 2  Pain Location: L hip Pain Descriptors / Indicators: Sore Pain Intervention(s): Monitored during session    Home Living                      Prior Function            PT Goals (current goals can now be found in the care plan section) Progress towards PT goals: Progressing toward goals    Frequency  Min 4X/week    PT Plan Current plan remains appropriate    Co-evaluation             End of Session Equipment Utilized During Treatment: Gait belt;Oxygen Activity Tolerance: Patient limited by fatigue Patient left: in chair;with call bell/phone within reach     Time: 9417-4081 PT Time Calculation (min): 28 min  Charges:  $Gait Training: 8-22 mins $Therapeutic Activity: 8-22 mins                    G Codes:      Weston Anna, MPT Pager: [phone]

## 2013-09-10 NOTE — Progress Notes (Addendum)
[facility] PROGRESS NOTE  Alexa Middleton GLO:[id number] DOB: 06/30/1927 DOA: 09/05/2013 PCP: No primary provider on file.  Brief Summary  Alexa Middleton is a 80 y.o. female with PMH of HTN and HLP who presented with mechanical fall with left hip fracture.  She underwent ORIF on 8/27 but her post-op course has been complicated by ectopic atrial tachycardia with intermittent atrial flutter/a-fib with RVR with rates in the 150-170s associated with mild hypotension to 90s.  She has been started on amiodarone per cardiology.  Plan for SNF on Thursday if HR remains okay on amiodarone.    Assessment/Plan  Subcapital fracture of left hip s/p left hip hemiarthoplasty on 8/27 by Dr. Wynelle Link -  Cont PT/OT -  Continue lovenox for DVT proph -  Continue stool softeners and pain medication -  Plan for SNF for rehab possibly tomorrow if no further arrhythmias on tele and if ECHO normal  Acute onset a-fib with RVR, spontaneously reverted to NSR, recurred yesterday -  Continue telemetry:  Another episode overnight of tachcardia, now NSR -  ECHO:  Preserved EF 60-65% with grade 1 DD, no significant valvular problems -  Troponins negative -  TSH 2.4 -  CT angio negative for PE -  Continue amiodarone, now changed to PO per cardiology  Possible pneumonia on CXR but CT chest was negative for pneumonia  HTN (hypertension), blood pressure still low to low normal -  Continue to hold losartan and Amlodipine   Hyperlipidemia, stable, continue statin  Hyponatremia, stable.   Mild hyperkalemia likely due to hemolysis and resolved spontaneously  Mild leukocytosis likely due to surgery, resolved.  No evidence of infection  -  CT angio negative for pneumonia -  UA neg  Acute blood loss anemia due to surgery, hemoglobin stable in mid-8 range -  asymptomatic -  Repeat in AM  Hyperglycemia, likely stress-related from surgery.  A1c 6.4.   Diet:  regular Access:  PIV IVF:  OFF Proph:  lovenox  Code  Status: full Family Communication: patient alone Disposition Plan:   To SNF for rehab in 1-2 days  Consultants:  Orthopedics, Dr. Wynelle Link  Cardiology, Dr. Martinique  Procedures:  Left hip hemiarthroplasty on 8/27   CXR   XR knee  XR left elbow  XR left hip  CT angio chest  Antibiotics:  Cefazolin perioperatively    Augmentin 8/30 >> 8/31  HPI/Subjective:  Episode of a-fib or a-flutter with RVR yesterday afternoon that became wide complex associated with hypotension.  Transferred back to SDU and doing well this AM.   Objective: Filed Vitals:   09/10/13 0300 09/10/13 0400 09/10/13 0500 09/10/13 0540  BP: 111/31 103/28 121/94   Pulse: 69 66 79   Temp:      TempSrc:      Resp: 20 20 21    Height:      Weight:    59.6 kg (131 lb 6.3 oz)  SpO2: 98% 97% 99%     Intake/Output Summary (Last 24 hours) at 09/10/13 0740 Last data filed at 09/10/13 0400  Gross per 24 hour  Intake 987.03 ml  Output      0 ml  Net 987.03 ml   Filed Weights   09/05/13 1711 09/08/13 1627 09/10/13 0540  Weight: 53.071 kg (117 lb) 58.4 kg (128 lb 12 oz) 59.6 kg (131 lb 6.3 oz)    Exam:   General:  WF, No acute distress  HEENT:  NCAT, MMM  Cardiovascular:  RRR, nl S1, S2  no mrg, 2+ pulses, warm extremities  Respiratory:  Some rales anterior right chest and left posterior chest and diminished throughout, mild wheeze, no rhonchi, no increased WOB  Abdomen:   NABS, soft, NT/ND  MSK:   Normal tone and bulk, no LEE.  Left hip dressing not examined today.  Left knee with moderate effusion  Neuro:  Grossly intact  Data Reviewed: Basic Metabolic Panel:  Recent Labs Lab 09/06/13 0457 09/07/13 0442 09/08/13 1454 09/09/13 0232 09/10/13 0336  NA 131* 137 128* 132* 131*  K 5.4* 4.4 4.2 4.0 3.8  CL 96 99 90* 94* 94*  CO2 26 28 26 28 28   GLUCOSE 156* 133* 172* 105* 112*  BUN 9 13 16 13 10   CREATININE 0.51 0.50 0.56 0.54 0.52  CALCIUM 7.9* 8.5 7.9* 7.7* 7.5*  MG  --   --  2.0  --    --   PHOS  --   --  3.1  --   --    Liver Function Tests: No results found for this basename: AST, ALT, ALKPHOS, BILITOT, PROT, ALBUMIN,  in the last 168 hours No results found for this basename: LIPASE, AMYLASE,  in the last 168 hours No results found for this basename: AMMONIA,  in the last 168 hours CBC:  Recent Labs Lab 09/05/13 1413 09/06/13 0457 09/07/13 0442 09/08/13 0359 09/09/13 0232 09/10/13 0336  WBC 10.1 11.1* 12.6* 13.8* 11.7* 10.4  NEUTROABS 8.4*  --   --   --   --   --   HGB 13.3 11.8* 9.8* 9.3* 8.1* 8.4*  HCT 39.5 35.8* 29.7* 27.6* 24.3* 24.9*  MCV 86.8 89.1 88.7 86.8 86.8 87.4  PLT 308 249 256 260 250 275   Cardiac Enzymes:  Recent Labs Lab 09/08/13 1454 09/08/13 2009 09/09/13 0232  TROPONINI <0.30 <0.30 <0.30   BNP (last 3 results) No results found for this basename: PROBNP,  in the last 8760 hours CBG: No results found for this basename: GLUCAP,  in the last 168 hours  Recent Results (from the past 240 hour(s))  SURGICAL PCR SCREEN     Status: None   Collection Time    09/05/13  5:58 PM      Result Value Ref Range Status   MRSA, PCR NEGATIVE  NEGATIVE Final   Staphylococcus aureus NEGATIVE  NEGATIVE Final   Comment:            The Xpert SA Assay (FDA     approved for NASAL specimens     in patients over 52 years of age),     is one component of     a comprehensive surveillance     program.  Test performance has     been validated by [employer] for patients greater     than or equal to 62 year old.     It is not intended     to diagnose infection nor to     guide or monitor treatment.  MRSA PCR SCREENING     Status: None   Collection Time    09/08/13  4:52 PM      Result Value Ref Range Status   MRSA by PCR NEGATIVE  NEGATIVE Final   Comment:            The GeneXpert MRSA Assay (FDA     approved for NASAL specimens     only), is one component of a     comprehensive MRSA colonization  surveillance program. It is not      intended to diagnose MRSA     infection nor to guide or     monitor treatment for     MRSA infections.     Studies: Ct Angio Chest Pe W/cm &/or Wo Cm  09/08/2013   CLINICAL DATA:  Elevated D-dimer. Hypotension, tachycardia. Atrial fib.  EXAM: CT ANGIOGRAPHY CHEST WITH CONTRAST  TECHNIQUE: Multidetector CT imaging of the chest was performed using the standard protocol during bolus administration of intravenous contrast. Multiplanar CT image reconstructions and MIPs were obtained to evaluate the vascular anatomy.  CONTRAST:  123mL OMNIPAQUE IOHEXOL 350 MG/ML SOLN  COMPARISON:  Chest x-ray earlier today.  FINDINGS: No filling defects in the pulmonary arteries to suggest pulmonary emboli. Small bilateral pleural effusions. Heart is normal size. Aorta is normal caliber. Coronary artery and aortic calcifications noted. No mediastinal, hilar, or axillary adenopathy.  Moderate paraseptal emphysema. Peripheral subpleural reticulation and interstitial thickening compatible with fibrosis, most pronounced in the lung bases. Airspace opacity at the left base likely related to scarring. No other confluent airspace opacities.  Imaging into the upper abdomen shows no acute findings.  No acute bony abnormality.  Review of the MIP images confirms the above findings.  IMPRESSION: COPD, fibrosis.  Opacity at the left lung base likely related to scarring/ fibrosis.  Small bilateral effusions.   Electronically Signed   By: Rolm Baptise M.D.   On: 09/08/2013 18:35   Dg Chest Port 1 View  09/08/2013   CLINICAL DATA:  80 year old female with leukocytosis status post surgery. Left hip arthroplasty. Initial encounter.  EXAM: PORTABLE CHEST - 1 VIEW  COMPARISON:  09/05/2013 and earlier.  FINDINGS: Portable AP semi upright view at 0944 hrs. Coarse bilateral pulmonary opacity. Stable lung volumes. Mildly increased left lung base opacity compared to prior Normal cardiac size and mediastinal contours. Visualized tracheal air column is  within normal limits.  IMPRESSION: Chronic lung disease with superimposed patchy left lung base opacity suspicious for acute infectious exacerbation in this setting.   Electronically Signed   By: Lars Pinks M.D.   On: 09/08/2013 09:55    Scheduled Meds: . amiodarone  400 mg Oral BID  . atorvastatin  10 mg Oral QHS  . docusate sodium  100 mg Oral BID  . enoxaparin (LOVENOX) injection  40 mg Subcutaneous Q24H  . senna  2 tablet Oral QHS  . sodium chloride  500 mL Intravenous Once  . sodium chloride  3 mL Intravenous Q12H   Continuous Infusions:    Principal Problem:   Subcapital fracture of left hip Active Problems:   Hip fracture   HTN (hypertension)   Hyperlipidemia   Hyponatremia   Leukocytosis, unspecified   Postoperative anemia due to acute blood loss   Aspiration pneumonia   Atrial fibrillation with RVR   Wide-complex tachycardia    Time spent: 30 min    Zoe Creasman, [facility] Pager [phone]. If 7PM-7AM, please contact night-coverage at [email or web address], password [facility] 09/10/2013, 7:40 AM  LOS: 5 days

## 2013-09-10 NOTE — Progress Notes (Signed)
CSW continuing to follow for disposition planning.   CSW met with pt at bedside to follow up regarding decision for SNF placement.   Pt discussed that pt and pt husband have been discussing rehab options and pt would like [facility] as she would like to have a private room. Pt husband not present at this time, but pt agreeable to CSW contacting via telephone. CSW contacted pt husband via telephone. Pt husband stated that he feel [facility] will be best option for pt as they have a private room and pt husband feels that pt will be more satisfied at a facility with a private room. CSW discussed with pt husband that pt had stated that she wanted the private room at [facility] as well.   CSW contacted [facility] and notified facility of pt acceptance of bed offer and confirmed that facility will have private room available when pt medically ready for discharge.  CSW contacted pt insurance, [employer] and spoke with [employer] Case Manager, Mickel Baas to update that pt has chosen [facility] . CSW updated [employer] and insurance requested updated clinicals be faxed to [facility]. CSW sent updated clinicals as requested.    CSW to continue to follow and facilitate pt discharge needs when pt medically ready for discharge.  Alison Murray, MSW, [place] [facility] [phone]

## 2013-09-10 NOTE — Progress Notes (Signed)
   Subjective: 5 Days Post-Op Procedure(s) (LRB): ARTHROPLASTY BIPOLAR HIP (Left) Patient reports pain as mild.   Patient seen in rounds with Dr. Wynelle Link. Hip is sore.  She states she has not been up on it much. Events of yesterday reviewed with Dr. Wynelle Link.  Transferred back to ICU again  And placed on IV amiodarone and now switched over to PO form.  Rate more controlled this morning in the 70's on rounds. Patient is well, but has had some minor complaints of pain in the hip, requiring pain medications Plan is to go Skilled nursing facility after hospital stay.  Objective: Vital signs in last 24 hours: Temp:  [98.1 F (36.7 C)-98.7 F (37.1 C)] 98.5 F (36.9 C) (08/31 2356) Pulse Rate:  [66-172] 79 (09/01 0500) Resp:  [16-30] 21 (09/01 0500) BP: (98-134)/(21-96) 121/94 mmHg (09/01 0500) SpO2:  [88 %-100 %] 99 % (09/01 0500) Weight:  [59.6 kg (131 lb 6.3 oz)] 59.6 kg (131 lb 6.3 oz) (09/01 0540)  Intake/Output from previous day:  Intake/Output Summary (Last 24 hours) at 09/10/13 0747 Last data filed at 09/10/13 0400  Gross per 24 hour  Intake 987.03 ml  Output      0 ml  Net 987.03 ml    Intake/Output this shift:    Labs:  Recent Labs  09/08/13 0359 09/09/13 0232 09/10/13 0336  HGB 9.3* 8.1* 8.4*    Recent Labs  09/09/13 0232 09/10/13 0336  WBC 11.7* 10.4  RBC 2.80* 2.85*  HCT 24.3* 24.9*  PLT 250 275    Recent Labs  09/09/13 0232 09/10/13 0336  NA 132* 131*  K 4.0 3.8  CL 94* 94*  CO2 28 28  BUN 13 10  CREATININE 0.54 0.52  GLUCOSE 105* 112*  CALCIUM 7.7* 7.5*    Recent Labs  09/08/13 1454  INR 1.03    EXAM General - Patient is Alert, Appropriate and Oriented Extremity - Neurovascular intact Sensation intact distally Dressing/Incision - clean, dry, no drainage Motor Function - intact, moving foot and toes well on exam.   Past Medical History  Diagnosis Date  . Cancer     skin cancer to left leg  . Hypertension      Assessment/Plan: 5 Days Post-Op Procedure(s) (LRB): ARTHROPLASTY BIPOLAR HIP (Left) Principal Problem:   Subcapital fracture of left hip Active Problems:   Hip fracture   HTN (hypertension)   Hyperlipidemia   Hyponatremia   Leukocytosis, unspecified   Postoperative anemia due to acute blood loss   Aspiration pneumonia   Atrial fibrillation with RVR   Wide-complex tachycardia  Estimated body mass index is 24.03 kg/(m^2) as calculated from the following:   Height as of this encounter: 5\' 2"  (1.575 m).   Weight as of this encounter: 59.6 kg (131 lb 6.3 oz). Up with therapy when okay from a medical standpoint.  DVT Prophylaxis - Lovenox Weight Bearing As Tolerated left Leg  Arlee Muslim, PA-C Orthopaedic Surgery 09/10/2013, 7:47 AM

## 2013-09-10 NOTE — Progress Notes (Signed)
Occupational Therapy Treatment Patient Details Name: Alexa Middleton MRN: [id number] DOB: 1927/09/09 Today's Date: 09/10/2013    History of present illness 80 yo female adm after fall resulting in L hip fx, s/p L hip hemiarthroplasty. New onset Afib/Aflutter.  PMHx: HTN   OT comments  Pt needs reinforcement with THPs.  Seen in SDU  Follow Up Recommendations  SNF    Equipment Recommendations  3 in 1 bedside comode    Recommendations for Other Services      Precautions / Restrictions Precautions Precautions: Posterior Hip;Fall Restrictions Weight Bearing Restrictions: No LLE Weight Bearing: Weight bearing as tolerated       Mobility Bed Mobility               General bed mobility comments: p  Transfers Overall transfer level: Needs assistance Equipment used: Rolling walker (2 wheeled) Transfers: Sit to/from Stand Sit to Stand: Mod assist         General transfer comment: assist to rise, stabilize, control descent. VCs safety, technique, hand/L LE placement. Increased time.     Balance                                   ADL Overall ADL's : Needs assistance/impaired                         Toilet Transfer: Moderate assistance;Stand-pivot;BSC   Toileting- Clothing Manipulation and Hygiene: Total assistance;Sit to/from stand         General ADL Comments: Assisted pt off of commode, back to recliner.  Mod A for sit to stand and cues for UE/LE placement.  Reviewed THPs.  Pt recalled 1/3.  Pt unable to safely release a hand in standing for adls.       Vision                     Perception     Praxis      Cognition   Behavior During Therapy: WFL for tasks assessed/performed Overall Cognitive Status: Impaired/Different from baseline       Memory: Decreased recall of precautions               Extremity/Trunk Assessment               Exercises    Shoulder Instructions       General Comments       Pertinent Vitals/ Pain       Pain Assessment: 0-10 Pain Score: 5  Pain Location: L hip Pain Descriptors / Indicators: Sore Pain Intervention(s): Repositioned;Ice applied;Limited activity within patient's tolerance;Monitored during session  Home Living                                          Prior Functioning/Environment              Frequency Min 2X/week     Progress Toward Goals  OT Goals(current goals can now be found in the care plan section)  Progress towards OT goals: Progressing toward goals (slowly)     Plan      Co-evaluation                 End of Session     Activity Tolerance Patient tolerated treatment well   Patient Left  in chair;with call bell/phone within reach   Nurse Communication          Time: 4103-0131 OT Time Calculation (min): 14 min  Charges: OT General Charges $OT Visit: 1 Procedure OT Treatments $Self Care/Home Management : 8-22 mins  Maven Varelas 09/10/2013, 2:40 PM   Lesle Chris, OTR/L [phone] 09/10/2013

## 2013-09-11 DIAGNOSIS — J189 Pneumonia, unspecified organism: Secondary | ICD-10-CM

## 2013-09-11 LAB — CBC
HCT: 26.1 % — ABNORMAL LOW (ref 36.0–46.0)
Hemoglobin: 8.8 g/dL — ABNORMAL LOW (ref 12.0–15.0)
MCH: 29.3 pg (ref 26.0–34.0)
MCHC: 33.7 g/dL (ref 30.0–36.0)
MCV: 87 fL (ref 78.0–100.0)
PLATELETS: 369 10*3/uL (ref 150–400)
RBC: 3 MIL/uL — ABNORMAL LOW (ref 3.87–5.11)
RDW: 14.7 % (ref 11.5–15.5)
WBC: 8.9 10*3/uL (ref 4.0–10.5)

## 2013-09-11 LAB — BASIC METABOLIC PANEL
Anion gap: 9 (ref 5–15)
BUN: 10 mg/dL (ref 6–23)
CALCIUM: 7.9 mg/dL — AB (ref 8.4–10.5)
CO2: 30 mEq/L (ref 19–32)
CREATININE: 0.52 mg/dL (ref 0.50–1.10)
Chloride: 94 mEq/L — ABNORMAL LOW (ref 96–112)
GFR calc non Af Amer: 85 mL/min — ABNORMAL LOW (ref >90)
Glucose, Bld: 130 mg/dL — ABNORMAL HIGH (ref 70–99)
Potassium: 3.7 mEq/L (ref 3.7–5.3)
Sodium: 133 mEq/L — ABNORMAL LOW (ref 137–147)

## 2013-09-11 NOTE — Progress Notes (Signed)
   Subjective: 6 Days Post-Op Procedure(s) (LRB): ARTHROPLASTY BIPOLAR HIP (Left) Patient reports pain as mild.  Husband in room at the bedside. Patient seen in rounds with Dr. Wynelle Link.  She has been moved back to the Tele floor. Patient is well, but has had some minor complaints of pain in the hip, requiring pain medications Plan is to go Skilled nursing facility after hospital stay.  Objective: Vital signs in last 24 hours: Temp:  [97.8 F (36.6 C)-97.9 F (36.6 C)] 97.9 F (36.6 C) (09/02 0440) Pulse Rate:  [67-75] 75 (09/02 0440) Resp:  [18] 18 (09/02 0440) BP: (118-122)/(36-44) 122/36 mmHg (09/02 0440) SpO2:  [98 %-100 %] 100 % (09/02 0440)  Intake/Output from previous day:  Intake/Output Summary (Last 24 hours) at 09/11/13 1252 Last data filed at 09/11/13 0700  Gross per 24 hour  Intake    180 ml  Output      0 ml  Net    180 ml    Labs:  Recent Labs  09/09/13 0232 09/10/13 0336 09/11/13 0420  HGB 8.1* 8.4* 8.8*    Recent Labs  09/10/13 0336 09/11/13 0420  WBC 10.4 8.9  RBC 2.85* 3.00*  HCT 24.9* 26.1*  PLT 275 369    Recent Labs  09/10/13 0336 09/11/13 0420  NA 131* 133*  K 3.8 3.7  CL 94* 94*  CO2 28 30  BUN 10 10  CREATININE 0.52 0.52  GLUCOSE 112* 130*  CALCIUM 7.5* 7.9*    Recent Labs  09/08/13 1454  INR 1.03    EXAM General - Patient is Alert, Appropriate and Oriented Extremity - Neurovascular intact Sensation intact distally Dressing/Incision - clean, dry, no drainage, healing Motor Function - intact, moving foot and toes well on exam.   Past Medical History  Diagnosis Date  . Cancer     skin cancer to left leg  . Hypertension     Assessment/Plan: 6 Days Post-Op Procedure(s) (LRB): ARTHROPLASTY BIPOLAR HIP (Left) Principal Problem:   Subcapital fracture of left hip Active Problems:   Hip fracture   HTN (hypertension)   Hyperlipidemia   Hyponatremia   Leukocytosis, unspecified   Postoperative anemia due to acute  blood loss   Aspiration pneumonia   Atrial fibrillation with RVR   Wide-complex tachycardia  Estimated body mass index is 24.03 kg/(m^2) as calculated from the following:   Height as of this encounter: 5\' 2"  (1.575 m).   Weight as of this encounter: 59.6 kg (131 lb 6.3 oz). Up with therapy  DVT Prophylaxis - Lovenox Weight Bearing As Tolerated left Leg  Arlee Muslim, PA-C Orthopaedic Surgery 09/11/2013, 12:52 PM

## 2013-09-11 NOTE — Care Management Note (Addendum)
    Page 1 of 2   09/13/2013     2:15:51 PM CARE MANAGEMENT NOTE 09/13/2013  Patient:  Alexa Middleton, Alexa Middleton   Account Number:  [id number]  Date Initiated:  09/06/2013  Documentation initiated by:  DAVIS,RHONDA  Subjective/Objective Assessment:   patient sustained fall at home and resulted in fracture of the left hip requiring surgical intervention.     Action/Plan:   home with hhc and family support/patient lives at home with spouse and has been indep. in adls and activites   Anticipated DC Date:  09/13/2013   Anticipated DC Plan:  SKILLED NURSING FACILITY  In-house referral  Clinical Social Worker      DC Planning Services  CM consult      [facility] Choice  NA   Choice offered to / List presented to:             Status of service:  Completed, signed off Medicare Important Message given?  YES (If response is "NO", the following Medicare IM given date fields will be blank) Date Medicare IM given:  09/08/2013 Medicare IM given by:  [facility] Date Additional Medicare IM given:  09/11/2013 Additional Medicare IM given by:  [facility]  Discharge Disposition:  [place]  Per UR Regulation:  Reviewed for med. necessity/level of care/duration of stay  If discussed at [place] of Stay Meetings, dates discussed:   09/12/2013    Comments:  09/13/13 Alexa Middleton NR,BNS NCM [phone] D/C SNF.  09/12/13 Alexa Frayre RN,BSN NCM [phone] AFIB YESTERDAY,CARDIO FOLLOWING.IF MED STABLE FOR D/C SNF IN AM.  09/11/13 Alexa Slabach RN,BSN NCM [phone] TRANSFER FROM SDU.D/C PLAN SNF IN AM.  [id number]/PIRJJO Davis,RN,BSN,CCM: patient went into a.fib transferred to sdu and placed on iv amiodarone drip.  09/08/2013 Pt has RW and 3n1 in the room. Will have family take home. Plan is for dc to SNF -rehab. Alexa Finner RN CCM Case Mgmt phone [phone]  [facility]

## 2013-09-11 NOTE — Progress Notes (Signed)
TELEMETRY: Reviewed telemetry pt in NSR. Very brief run of atrial tachycardia at 4:11am today.  Filed Vitals:   09/10/13 1200 09/10/13 1559 09/10/13 2210 09/11/13 0440  BP: 120/38 118/44 119/39 122/36  Pulse: 107 67 71 75  Temp: 98.1 F (36.7 C) 97.8 F (36.6 C) 97.9 F (36.6 C) 97.9 F (36.6 C)  TempSrc: Oral Oral Oral Oral  Resp: 24 18 18 18   Height:      Weight:      SpO2: 98% 100% 98% 100%    Intake/Output Summary (Last 24 hours) at 09/11/13 0804 Last data filed at 09/11/13 0700  Gross per 24 hour  Intake    700 ml  Output      0 ml  Net    700 ml   Filed Weights   09/05/13 1711 09/08/13 1627 09/10/13 0540  Weight: 117 lb (53.071 kg) 128 lb 12 oz (58.4 kg) 131 lb 6.3 oz (59.6 kg)    Subjective Denies chest pain, SOB, or cough.  Marland Kitchen amiodarone  400 mg Oral BID  . atorvastatin  10 mg Oral QHS  . docusate sodium  100 mg Oral BID  . enoxaparin (LOVENOX) injection  40 mg Subcutaneous Q24H  . senna  2 tablet Oral QHS  . sodium chloride  500 mL Intravenous Once  . sodium chloride  3 mL Intravenous Q12H      LABS: Basic Metabolic Panel:  Recent Labs  09/08/13 1454  09/10/13 0336 09/11/13 0420  NA 128*  < > 131* 133*  K 4.2  < > 3.8 3.7  CL 90*  < > 94* 94*  CO2 26  < > 28 30  GLUCOSE 172*  < > 112* 130*  BUN 16  < > 10 10  CREATININE 0.56  < > 0.52 0.52  CALCIUM 7.9*  < > 7.5* 7.9*  MG 2.0  --   --   --   PHOS 3.1  --   --   --   < > = values in this interval not displayed. Liver Function Tests: No results found for this basename: AST, ALT, ALKPHOS, BILITOT, PROT, ALBUMIN,  in the last 72 hours No results found for this basename: LIPASE, AMYLASE,  in the last 72 hours CBC:  Recent Labs  09/10/13 0336 09/11/13 0420  WBC 10.4 8.9  HGB 8.4* 8.8*  HCT 24.9* 26.1*  MCV 87.4 87.0  PLT 275 369   Cardiac Enzymes:  Recent Labs  09/08/13 1454 09/08/13 2009 09/09/13 0232  TROPONINI <0.30 <0.30 <0.30   BNP: No results found for this basename:  PROBNP,  in the last 72 hours D-Dimer:  Recent Labs  09/08/13 1454  DDIMER 1.64*   Hemoglobin A1C: No results found for this basename: HGBA1C,  in the last 72 hours Fasting Lipid Panel: No results found for this basename: CHOL, HDL, LDLCALC, TRIG, CHOLHDL, LDLDIRECT,  in the last 72 hours Thyroid Function Tests:  Recent Labs  09/09/13 0232  TSH 2.400     Radiology/Studies:  No results found. Echo: Study Conclusions  - Left ventricle: The cavity size was normal. Systolic function was normal. The estimated ejection fraction was in the range of 60% to 65%. Wall motion was normal; there were no regional wall motion abnormalities. Doppler parameters are consistent with abnormal left ventricular relaxation (grade 1 diastolic dysfunction). Doppler parameters are consistent with elevated ventricular end-diastolic filling pressure. - Aortic valve: Trileaflet; mildly thickened, mildly calcified leaflets. There was no regurgitation. - Aortic root:  The aortic root was normal in size. - Mitral valve: Structurally normal valve. There was mild regurgitation. - Right ventricle: Systolic function was normal. - Right atrium: The atrium was normal in size. - Pulmonic valve: There was mild regurgitation. - Pulmonary arteries: Systolic pressure was within the normal range. - Inferior vena cava: The vessel was normal in size. - Pericardium, extracardiac: There was no pericardial effusion.    PHYSICAL EXAM General: Well developed, well nourished, in no acute distress. Head: Normocephalic, atraumatic, sclera non-icteric, oropharynx is clear Neck: Negative for carotid bruits. JVD not elevated. No adenopathy Lungs: Clear bilaterally to auscultation without wheezes, rales, or rhonchi. Breathing is unlabored. Heart: RRR S1 S2 without murmurs, rubs, or gallops.  Abdomen: Soft, non-tender, non-distended with normoactive bowel sounds.  No obvious abdominal masses. Extremities: No  edema.   Distal pedal pulses are 2+ and equal bilaterally. Neuro: Alert and oriented X 3. Moves all extremities spontaneously.   ASSESSMENT AND PLAN: 1. Paroxysmal Afib/flutter. Recurrent atrial flutter 8/31 afternoon with LBBB aberrancy.   Arrhythmia probably related to stress of recent hip fracture/surgery and anemia. Echo shows normal LV function and normal atrial size. Rhythm well controlled on po amiodarone. Would continue current therapy and monitor on telemetry for 24 more hours.  2. Hypotension. Resolved. Amlodipine and losartan held. Hydrated.  3. Hyponatremia. Improved with IV NS. 4. Left hip fracture s/p surgery. 5. COPD/emphysema. No evidence of PNA on CT. Fibrosis. No evidence of PE. 6. Post Op anemia. 7. History of HTN.  OK to progress with Rehab therapies.   Present on Admission:  . Subcapital fracture of left hip . Hip fracture . HTN (hypertension) . Hyperlipidemia  Signed, Alexa Middleton, [place] 09/11/2013 8:04 AM

## 2013-09-11 NOTE — Progress Notes (Signed)
Physical Therapy Treatment Patient Details Name: Alexa Middleton MRN: [id number] DOB: 07-07-1927 Today's Date: 09/11/2013    History of Present Illness 80 yo female adm after fall resulting in L hip fx, s/p L hip hemiarthroplasty. New onset Afib/Aflutter.  PMHx: HTN    PT Comments    Progressing with mobility. HR 103 bpm during ambulation-RN aware. Pt possibly to d/c to SNF on today.   Follow Up Recommendations  SNF     Equipment Recommendations  Rolling walker with 5" wheels    Recommendations for Other Services       Precautions / Restrictions Precautions Precautions: Posterior Hip;Fall Precaution Comments: Pt unable to recall hip precautions. Reviewed all.  Restrictions Weight Bearing Restrictions: Yes LLE Weight Bearing: Weight bearing as tolerated    Mobility  Bed Mobility               General bed mobility comments: pt in recliner  Transfers Overall transfer level: Needs assistance Equipment used: Rolling walker (2 wheeled) Transfers: Sit to/from Stand Sit to Stand: Mod assist         General transfer comment: assist to rise, stabilize, control descent. VCs safety, technique, hand/L LE placement. Increased time.   Ambulation/Gait Ambulation/Gait assistance: Min assist Ambulation Distance (Feet): 40 Feet Assistive device: Rolling walker (2 wheeled) Gait Pattern/deviations: Step-to pattern;Decreased stride length;Antalgic     General Gait Details: assist to stabilize. followed with recliner. VCS safety, technique, sequence. pt fatigues fairly easily. Ambulated on 2L O2. HR 103 bpm   Stairs            Wheelchair Mobility    Modified Rankin (Stroke Patients Only)       Balance                                    Cognition   Behavior During Therapy: WFL for tasks assessed/performed Overall Cognitive Status: Impaired/Different from baseline Area of Impairment: Memory;Safety/judgement;Following commands     Memory:  Decreased recall of precautions;Decreased short-term memory Following Commands: Follows one step commands with increased time            Exercises General Exercises - Lower Extremity Ankle Circles/Pumps: AROM;Both;10 reps;Seated Quad Sets: AROM;Both;10 reps;Seated Heel Slides: AAROM;Left;10 reps;Seated Hip ABduction/ADduction: AAROM;Left;10 reps;Seated    General Comments        Pertinent Vitals/Pain Pain Assessment: 0-10 Pain Score: 9  Pain Location: L hip/thigh area Pain Intervention(s): Repositioned;Ice applied;Limited activity within patient's tolerance;Monitored during session    Home Living                      Prior Function            PT Goals (current goals can now be found in the care plan section) Progress towards PT goals: Progressing toward goals    Frequency  Min 4X/week    PT Plan Current plan remains appropriate    Co-evaluation             End of Session Equipment Utilized During Treatment: Gait belt;Oxygen Activity Tolerance: Patient limited by fatigue;Patient limited by pain Patient left: in chair;with call bell/phone within reach     Time: 2595-6387 PT Time Calculation (min): 29 min  Charges:  $Gait Training: 8-22 mins $Therapeutic Exercise: 8-22 mins                    G Codes:  Weston Anna, MPT Pager: [phone]

## 2013-09-11 NOTE — Progress Notes (Signed)
[facility] PROGRESS NOTE  Alexa Middleton YWV:[id number] DOB: Sep 09, 1927 DOA: 09/05/2013 PCP: No primary provider on file.  HPI/Subjective: Seen sitting at bedside, denies any significant complaints. Heart rate is controlled with amiodarone, cardiology please advise if patient needs anticoagulation.  Brief Summary  Alexa Middleton is a 80 y.o. female with PMH of HTN and HLP who presented with mechanical fall with left hip fracture.  She underwent ORIF on 8/27 but her post-op course has been complicated by ectopic atrial tachycardia with intermittent atrial flutter/a-fib with RVR with rates in the 150-170s associated with mild hypotension to 90s.  She has been started on amiodarone per cardiology.  Plan for SNF on Thursday if HR remains okay on amiodarone.    Assessment/Plan  Subcapital fracture of left hip s/p left hip hemiarthoplasty on 8/27 by Dr. Wynelle Link -  Cont PT/OT -  Continue lovenox for DVT proph -  Continue stool softeners and pain medication -  Plan for SNF for rehab possibly tomorrow if no further arrhythmias on tele and if ECHO normal  Acute onset a-fib with RVR, spontaneously reverted to NSR, recurred yesterday -  Continue telemetry:  Another episode overnight of tachcardia, now NSR. -  ECHO:  Preserved EF 60-65% with grade 1 DD, no significant valvular problems -  Troponins negative. -  TSH 2.4 -  CT angio negative for PE -  Continue amiodarone, cardiology recommended to watch her for 24 hours.  Possible pneumonia on CXR but CT chest was negative for pneumonia  HTN (hypertension), blood pressure still low to low normal -  Continue to hold losartan and Amlodipine   Hyperlipidemia, stable, continue statin  Hyponatremia, stable.   Mild hyperkalemia likely due to hemolysis and resolved spontaneously  Mild leukocytosis likely due to surgery, resolved.  No evidence of infection  -  CT angio negative for pneumonia -  UA neg  Acute blood loss anemia due to surgery,  hemoglobin stable in mid-8 range -  asymptomatic -  Repeat in AM  Hyperglycemia, likely stress-related from surgery.  A1c 6.4.   Diet:  regular Access:  PIV IVF:  OFF Proph:  lovenox  Code Status: full Family Communication: patient alone Disposition Plan:   To SNF for rehab in 1-2 days  Consultants:  Orthopedics, Dr. Wynelle Link  Cardiology, Dr. Martinique  Procedures:  Left hip hemiarthroplasty on 8/27   CXR   XR knee  XR left elbow  XR left hip  CT angio chest  Antibiotics:  Cefazolin perioperatively    Augmentin 8/30 >> 8/31    Objective: Filed Vitals:   09/10/13 1559 09/10/13 2210 09/11/13 0440 09/11/13 1418  BP: 118/44 119/39 122/36 109/41  Pulse: 67 71 75 81  Temp: 97.8 F (36.6 C) 97.9 F (36.6 C) 97.9 F (36.6 C) 98 F (36.7 C)  TempSrc: Oral Oral Oral Oral  Resp: 18 18 18 18   Height:      Weight:      SpO2: 100% 98% 100% 100%    Intake/Output Summary (Last 24 hours) at 09/11/13 1457 Last data filed at 09/11/13 0700  Gross per 24 hour  Intake    180 ml  Output      0 ml  Net    180 ml   Filed Weights   09/05/13 1711 09/08/13 1627 09/10/13 0540  Weight: 53.071 kg (117 lb) 58.4 kg (128 lb 12 oz) 59.6 kg (131 lb 6.3 oz)    Exam:   General:  WF, No acute distress  HEENT:  NCAT, MMM  Cardiovascular:  RRR, nl S1, S2 no mrg, 2+ pulses, warm extremities  Respiratory:  Some rales anterior right chest and left posterior chest and diminished throughout, mild wheeze, no rhonchi, no increased WOB  Abdomen:   NABS, soft, NT/ND  MSK:   Normal tone and bulk, no LEE.  Left hip dressing not examined today.  Left knee with moderate effusion  Neuro:  Grossly intact  Data Reviewed: Basic Metabolic Panel:  Recent Labs Lab 09/07/13 0442 09/08/13 1454 09/09/13 0232 09/10/13 0336 09/11/13 0420  NA 137 128* 132* 131* 133*  K 4.4 4.2 4.0 3.8 3.7  CL 99 90* 94* 94* 94*  CO2 28 26 28 28 30   GLUCOSE 133* 172* 105* 112* 130*  BUN 13 16 13 10 10    CREATININE 0.50 0.56 0.54 0.52 0.52  CALCIUM 8.5 7.9* 7.7* 7.5* 7.9*  MG  --  2.0  --   --   --   PHOS  --  3.1  --   --   --    Liver Function Tests: No results found for this basename: AST, ALT, ALKPHOS, BILITOT, PROT, ALBUMIN,  in the last 168 hours No results found for this basename: LIPASE, AMYLASE,  in the last 168 hours No results found for this basename: AMMONIA,  in the last 168 hours CBC:  Recent Labs Lab 09/05/13 1413  09/07/13 0442 09/08/13 0359 09/09/13 0232 09/10/13 0336 09/11/13 0420  WBC 10.1  < > 12.6* 13.8* 11.7* 10.4 8.9  NEUTROABS 8.4*  --   --   --   --   --   --   HGB 13.3  < > 9.8* 9.3* 8.1* 8.4* 8.8*  HCT 39.5  < > 29.7* 27.6* 24.3* 24.9* 26.1*  MCV 86.8  < > 88.7 86.8 86.8 87.4 87.0  PLT 308  < > 256 260 250 275 369  < > = values in this interval not displayed. Cardiac Enzymes:  Recent Labs Lab 09/08/13 1454 09/08/13 2009 09/09/13 0232  TROPONINI <0.30 <0.30 <0.30   BNP (last 3 results) No results found for this basename: PROBNP,  in the last 8760 hours CBG: No results found for this basename: GLUCAP,  in the last 168 hours  Recent Results (from the past 240 hour(s))  SURGICAL PCR SCREEN     Status: None   Collection Time    09/05/13  5:58 PM      Result Value Ref Range Status   MRSA, PCR NEGATIVE  NEGATIVE Final   Staphylococcus aureus NEGATIVE  NEGATIVE Final   Comment:            The Xpert SA Assay (FDA     approved for NASAL specimens     in patients over 34 years of age),     is one component of     a comprehensive surveillance     program.  Test performance has     been validated by [employer] for patients greater     than or equal to 86 year old.     It is not intended     to diagnose infection nor to     guide or monitor treatment.  MRSA PCR SCREENING     Status: None   Collection Time    09/08/13  4:52 PM      Result Value Ref Range Status   MRSA by PCR NEGATIVE  NEGATIVE Final   Comment:  The  GeneXpert MRSA Assay (FDA     approved for NASAL specimens     only), is one component of a     comprehensive MRSA colonization     surveillance program. It is not     intended to diagnose MRSA     infection nor to guide or     monitor treatment for     MRSA infections.     Studies: No results found.  Scheduled Meds: . amiodarone  400 mg Oral BID  . atorvastatin  10 mg Oral QHS  . docusate sodium  100 mg Oral BID  . enoxaparin (LOVENOX) injection  40 mg Subcutaneous Q24H  . senna  2 tablet Oral QHS  . sodium chloride  500 mL Intravenous Once  . sodium chloride  3 mL Intravenous Q12H   Continuous Infusions:    Principal Problem:   Subcapital fracture of left hip Active Problems:   Hip fracture   HTN (hypertension)   Hyperlipidemia   Hyponatremia   Leukocytosis, unspecified   Postoperative anemia due to acute blood loss   Aspiration pneumonia   Atrial fibrillation with RVR   Wide-complex tachycardia    Time spent: 30 min    [facility] Pager [phone]. If 7PM-7AM, please contact night-coverage at [email or web address], password [facility] 09/11/2013, 2:57 PM  LOS: 6 days

## 2013-09-12 DIAGNOSIS — Z96649 Presence of unspecified artificial hip joint: Secondary | ICD-10-CM

## 2013-09-12 LAB — BASIC METABOLIC PANEL
ANION GAP: 9 (ref 5–15)
BUN: 10 mg/dL (ref 6–23)
CO2: 31 mEq/L (ref 19–32)
Calcium: 7.8 mg/dL — ABNORMAL LOW (ref 8.4–10.5)
Chloride: 94 mEq/L — ABNORMAL LOW (ref 96–112)
Creatinine, Ser: 0.53 mg/dL (ref 0.50–1.10)
GFR calc Af Amer: >90 mL/min (ref >90)
GFR, EST NON AFRICAN AMERICAN: 85 mL/min — AB (ref >90)
Glucose, Bld: 117 mg/dL — ABNORMAL HIGH (ref 70–99)
Potassium: 4 mEq/L (ref 3.7–5.3)
SODIUM: 134 meq/L — AB (ref 137–147)

## 2013-09-12 LAB — CBC
HCT: 24.5 % — ABNORMAL LOW (ref 36.0–46.0)
Hemoglobin: 8.2 g/dL — ABNORMAL LOW (ref 12.0–15.0)
MCH: 29.1 pg (ref 26.0–34.0)
MCHC: 33.5 g/dL (ref 30.0–36.0)
MCV: 86.9 fL (ref 78.0–100.0)
PLATELETS: 352 10*3/uL (ref 150–400)
RBC: 2.82 MIL/uL — ABNORMAL LOW (ref 3.87–5.11)
RDW: 14.8 % (ref 11.5–15.5)
WBC: 9.9 10*3/uL (ref 4.0–10.5)

## 2013-09-12 MED ORDER — METOPROLOL TARTRATE 25 MG PO TABS
25.0000 mg | ORAL_TABLET | Freq: Two times a day (BID) | ORAL | Status: DC
  Administered 2013-09-12 – 2013-09-13 (×3): 25 mg via ORAL
  Filled 2013-09-12 (×4): qty 1

## 2013-09-12 MED ORDER — APIXABAN 2.5 MG PO TABS
2.5000 mg | ORAL_TABLET | Freq: Two times a day (BID) | ORAL | Status: DC
  Administered 2013-09-12 – 2013-09-13 (×3): 2.5 mg via ORAL
  Filled 2013-09-12 (×4): qty 1

## 2013-09-12 MED ORDER — SIMVASTATIN 20 MG PO TABS
20.0000 mg | ORAL_TABLET | Freq: Every day | ORAL | Status: DC
  Administered 2013-09-12: 20 mg via ORAL
  Filled 2013-09-12 (×2): qty 1

## 2013-09-12 MED ORDER — ENOXAPARIN SODIUM 40 MG/0.4ML ~~LOC~~ SOLN: 40.0000 mg | SUBCUTANEOUS | Status: DC

## 2013-09-12 MED ORDER — METHOCARBAMOL 500 MG PO TABS: 500.0000 mg | ORAL_TABLET | Freq: Four times a day (QID) | ORAL | Status: DC | PRN

## 2013-09-12 MED ORDER — HYDROCODONE-ACETAMINOPHEN 5-325 MG PO TABS: 1.0000 | ORAL_TABLET | ORAL | Status: DC | PRN

## 2013-09-12 MED ORDER — TRAMADOL HCL 50 MG PO TABS: 50.0000 mg | ORAL_TABLET | Freq: Four times a day (QID) | ORAL | Status: DC | PRN

## 2013-09-12 NOTE — Progress Notes (Signed)
[facility] PROGRESS NOTE  Alexa Middleton RCV:[id number] DOB: 1927/08/13 DOA: 09/05/2013 PCP: No primary provider on file.  HPI/Subjective: Seen sitting at bedside, denies any significant complaints. Feels much better, and had tachycardia with heart rate up to the 150s yesterday. Low dose of beta blockers added.  Brief Summary  Alexa Middleton is a 80 y.o. female with PMH of HTN and HLP who presented with mechanical fall with left hip fracture.  She underwent ORIF on 8/27 but her post-op course has been complicated by ectopic atrial tachycardia with intermittent atrial flutter/a-fib with RVR with rates in the 150-170s associated with mild hypotension to 90s.  She has been started on amiodarone per cardiology.  Plan for SNF on Thursday if HR remains okay on amiodarone.    Assessment/Plan  Subcapital fracture of left hip s/p left hip hemiarthoplasty on 8/27 by Dr. Wynelle Link -  Cont PT/OT -  Lovenox discontinued as patient started on full and to coagulation for atrial fibrillation with Eliquis. -  Continue stool softeners and pain medication -  Plan for SNF for rehab possibly tomorrow if no further arrhythmias on tele and if ECHO normal  Acute onset a-fib with RVR, spontaneously reverted to NSR, recurred yesterday -  Continue telemetry:  Another episode overnight of tachcardia, now NSR. -  ECHO:  Preserved EF 60-65% with grade 1 DD, no significant valvular problems -  Troponins negative. -  TSH 2.4 -  CT angio negative for PE -  Continue amiodarone, metoprolol and Eliquis.  Possible pneumonia on CXR but CT chest was negative for pneumonia  HTN (hypertension), blood pressure still low to low normal -  Continue to hold losartan and Amlodipine   Hyperlipidemia, stable, continue statin  Hyponatremia, stable.   Mild hyperkalemia likely due to hemolysis and resolved spontaneously  Mild leukocytosis likely due to surgery, resolved.  No evidence of infection  -  CT angio negative for  pneumonia -  UA neg  Acute blood loss anemia due to surgery, hemoglobin stable in mid-8 range -  asymptomatic -  Repeat in AM  Hyperglycemia, likely stress-related from surgery.  A1c 6.4.   Diet:  regular Access:  PIV IVF:  OFF Proph:  lovenox  Code Status: full Family Communication: patient alone Disposition Plan:   To SNF for rehab in 1-2 days  Consultants:  Orthopedics, Dr. Wynelle Link  Cardiology, Dr. Martinique  Procedures:  Left hip hemiarthroplasty on 8/27   CXR   XR knee  XR left elbow  XR left hip  CT angio chest  Antibiotics:  Cefazolin perioperatively    Augmentin 8/30 >> 8/31    Objective: Filed Vitals:   09/11/13 2018 09/12/13 0520 09/12/13 1324 09/12/13 1335  BP: 127/41 121/53 113/35 117/35  Pulse: 87 82 75   Temp: 97.9 F (36.6 C) 98.4 F (36.9 C) 98 F (36.7 C)   TempSrc: Oral Oral Oral   Resp: 20 20 18    Height:      Weight:      SpO2: 98% 98% 94%     Intake/Output Summary (Last 24 hours) at 09/12/13 1452 Last data filed at 09/12/13 0958  Gross per 24 hour  Intake    483 ml  Output      0 ml  Net    483 ml   Filed Weights   09/05/13 1711 09/08/13 1627 09/10/13 0540  Weight: 53.071 kg (117 lb) 58.4 kg (128 lb 12 oz) 59.6 kg (131 lb 6.3 oz)    Exam:  General:  WF, No acute distress  HEENT:  NCAT, MMM  Cardiovascular:  RRR, nl S1, S2 no mrg, 2+ pulses, warm extremities  Respiratory:  Some rales anterior right chest and left posterior chest and diminished throughout, mild wheeze, no rhonchi, no increased WOB  Abdomen:   NABS, soft, NT/ND  MSK:   Normal tone and bulk, no LEE.  Left hip dressing not examined today.  Left knee with moderate effusion  Neuro:  Grossly intact  Data Reviewed: Basic Metabolic Panel:  Recent Labs Lab 09/07/13 0442 09/08/13 1454 09/09/13 0232 09/10/13 0336 09/11/13 0420 09/12/13 0430  NA 137 128* 132* 131* 133* 134*  K 4.4 4.2 4.0 3.8 3.7 4.0  CL 99 90* 94* 94* 94* 94*  CO2 28 26 28  28 30 31   GLUCOSE 133* 172* 105* 112* 130* 117*  BUN 13 16 13 10 10 10   CREATININE 0.50 0.56 0.54 0.52 0.52 0.53  CALCIUM 8.5 7.9* 7.7* 7.5* 7.9* 7.8*  MG  --  2.0  --   --   --   --   PHOS  --  3.1  --   --   --   --    Liver Function Tests: No results found for this basename: AST, ALT, ALKPHOS, BILITOT, PROT, ALBUMIN,  in the last 168 hours No results found for this basename: LIPASE, AMYLASE,  in the last 168 hours No results found for this basename: AMMONIA,  in the last 168 hours CBC:  Recent Labs Lab 09/08/13 0359 09/09/13 0232 09/10/13 0336 09/11/13 0420 09/12/13 0430  WBC 13.8* 11.7* 10.4 8.9 9.9  HGB 9.3* 8.1* 8.4* 8.8* 8.2*  HCT 27.6* 24.3* 24.9* 26.1* 24.5*  MCV 86.8 86.8 87.4 87.0 86.9  PLT 260 250 275 369 352   Cardiac Enzymes:  Recent Labs Lab 09/08/13 1454 09/08/13 2009 09/09/13 0232  TROPONINI <0.30 <0.30 <0.30   BNP (last 3 results) No results found for this basename: PROBNP,  in the last 8760 hours CBG: No results found for this basename: GLUCAP,  in the last 168 hours  Recent Results (from the past 240 hour(s))  SURGICAL PCR SCREEN     Status: None   Collection Time    09/05/13  5:58 PM      Result Value Ref Range Status   MRSA, PCR NEGATIVE  NEGATIVE Final   Staphylococcus aureus NEGATIVE  NEGATIVE Final   Comment:            The Xpert SA Assay (FDA     approved for NASAL specimens     in patients over 76 years of age),     is one component of     a comprehensive surveillance     program.  Test performance has     been validated by [employer] for patients greater     than or equal to 14 year old.     It is not intended     to diagnose infection nor to     guide or monitor treatment.  MRSA PCR SCREENING     Status: None   Collection Time    09/08/13  4:52 PM      Result Value Ref Range Status   MRSA by PCR NEGATIVE  NEGATIVE Final   Comment:            The GeneXpert MRSA Assay (FDA     approved for NASAL specimens     only),  is one  component of a     comprehensive MRSA colonization     surveillance program. It is not     intended to diagnose MRSA     infection nor to guide or     monitor treatment for     MRSA infections.     Studies: No results found.  Scheduled Meds: . amiodarone  400 mg Oral BID  . apixaban  2.5 mg Oral BID  . docusate sodium  100 mg Oral BID  . metoprolol tartrate  25 mg Oral BID  . senna  2 tablet Oral QHS  . simvastatin  20 mg Oral q1800  . sodium chloride  500 mL Intravenous Once  . sodium chloride  3 mL Intravenous Q12H   Continuous Infusions:    Principal Problem:   Subcapital fracture of left hip Active Problems:   Hip fracture   HTN (hypertension)   Hyperlipidemia   Hyponatremia   Leukocytosis, unspecified   Postoperative anemia due to acute blood loss   Aspiration pneumonia   Atrial fibrillation with RVR   Wide-complex tachycardia    Time spent: 30 min    [facility] Pager [phone]. If 7PM-7AM, please contact night-coverage at [email or web address], password [facility] 09/12/2013, 2:52 PM  LOS: 7 days

## 2013-09-12 NOTE — Progress Notes (Signed)
Pt converted into A-fib with RVR. HR reaching 147. Pt asymptomatic and VSS. Pt converted back into NSR before EKG was obtained. Telemetry strip saved and placed in chart. Will continue to monitor closely.

## 2013-09-12 NOTE — Progress Notes (Signed)
Physical Therapy Treatment Patient Details Name: Alexa Middleton MRN: [id number] DOB: [birth year] Today's Date: 2013-10-12    History of Present Illness 80 yo female adm after fall resulting in L hip fx, s/p L hip hemiarthroplasty. New onset Afib/Aflutter.  PMHx: HTN    PT Comments    Pt ambulated in hallway with increased time.  Pt having difficulty recalling and practicing posterior hip precautions so reviewed beginning and end of session as well as during mobility due to pt requiring cues.  Follow Up Recommendations  SNF     Equipment Recommendations  Rolling walker with 5" wheels    Recommendations for Other Services       Precautions / Restrictions Precautions Precautions: Posterior Hip;Fall Precaution Comments: pt able to recall 1/3 hip precautions. reviewed precautions and wrote them on pt's white board in room Restrictions LLE Weight Bearing: Weight bearing as tolerated    Mobility  Bed Mobility               General bed mobility comments: pt in recliner  Transfers Overall transfer level: Needs assistance Equipment used: Rolling walker (2 wheeled) Transfers: Sit to/from [facility] Sit to Stand: Min assist Stand pivot transfers: Min assist       General transfer comment: verbal cues for technique and maintaining precautions, assist to rise and stabilize, increased time  Ambulation/Gait Ambulation/Gait assistance: Min assist Ambulation Distance (Feet): 50 Feet Assistive device: Rolling walker (2 wheeled) Gait Pattern/deviations: Step-to pattern;Antalgic Gait velocity: decr   General Gait Details: assist to steady, verbal cues for sequence, posture, turning toward unaffected side, increased time   Stairs            Wheelchair Mobility    Modified Rankin (Stroke Patients Only)       Balance                                    Cognition Arousal/Alertness: Awake/alert Behavior During Therapy: WFL for tasks  assessed/performed Overall Cognitive Status: Impaired/Different from baseline Area of Impairment: Memory;Following commands     Memory: Decreased recall of precautions;Decreased short-term memory Following Commands: Follows one step commands with increased time            Exercises      General Comments        Pertinent Vitals/Pain Pain Assessment: 0-10 Pain Score: 4  Pain Location: L hip  Pain Descriptors / Indicators: Sore Pain Intervention(s): Limited activity within patient's tolerance;Monitored during session;Ice applied    Home Living                      Prior Function            PT Goals (current goals can now be found in the care plan section) Progress towards PT goals: Progressing toward goals    Frequency  Min 4X/week    PT Plan Current plan remains appropriate    Co-evaluation             End of Session Equipment Utilized During Treatment: Gait belt Activity Tolerance: Patient tolerated treatment well Patient left: in chair;with call bell/phone within reach     Time: 0623-7628 PT Time Calculation (min): 27 min  Charges:  $Gait Training: 23-37 mins                    G Codes:      Alexa Middleton,Alexa Middleton 10-12-2013,  12:16 PM Alexa Middleton, PT, DPT 09/12/2013 Pager: [phone]

## 2013-09-12 NOTE — Progress Notes (Signed)
ANTICOAGULATION CONSULT NOTE - Initial Consult  Pharmacy Consult for Eliquis (apixaban) Indication: atrial fibrillation  No Known Allergies  Patient Measurements: Height: 5\' 2"  (157.5 cm) Weight: 131 lb 6.3 oz (59.6 kg) IBW/kg (Calculated) : 50.1   Vital Signs: Temp: 98.4 F (36.9 C) (09/03 0520) Temp src: Oral (09/03 0520) BP: 121/53 mmHg (09/03 0520) Pulse Rate: 82 (09/03 0520)  Labs:  Recent Labs  09/10/13 0336 09/11/13 0420 09/12/13 0430  HGB 8.4* 8.8* 8.2*  HCT 24.9* 26.1* 24.5*  PLT 275 369 352  CREATININE 0.52 0.52 0.53    Estimated Creatinine Clearance: 41.4 ml/min (by C-G formula based on Cr of 0.53).   Medical History: Past Medical History  Diagnosis Date  . Cancer     skin cancer to left leg  . Hypertension     Medications:  Scheduled:  . amiodarone  400 mg Oral BID  . atorvastatin  10 mg Oral QHS  . docusate sodium  100 mg Oral BID  . metoprolol tartrate  25 mg Oral BID  . senna  2 tablet Oral QHS  . sodium chloride  500 mL Intravenous Once  . sodium chloride  3 mL Intravenous Q12H   Infusions:   PRN: acetaminophen, acetaminophen, alum & mag hydroxide-simeth, bisacodyl, HYDROcodone-acetaminophen, menthol-cetylpyridinium, methocarbamol (ROBAXIN) IV, methocarbamol, metoCLOPramide (REGLAN) injection, metoCLOPramide, ondansetron (ZOFRAN) IV, ondansetron, phenol, polyethylene glycol, sodium chloride, traMADol  Assessment: 37 YOF admitted for hip fracture (s/p repair 8/27) noted to have Afib on 8/30 and ordered Heparin drip.  Prior to initiation of heparin, she converted to NSR upon transfer to ICU and was then maintained on prophylactic Lovenox.  She now requires chronic anticoagulation due to recurrent paroxysmal Afib on 9/2 PM.  Pharmacy was initially consulted to dose Xarelto (rivaroxaban), but due to drug interactions, has been changed to Eliquis (apixaban).     Last Lovenox 40mg  dose 9/2 at 10am.  SCr 0.53, stable, with CrCl ~ 41  ml/min  CBC:  Hgb 8.2 is low, but fairly stable.  Plt WNL.  POD#7, no bleeding or complications reported by ortho  Plan to use dose reduced apixaban for: Age ?80 years, body weight ?60 kg  Drug-drug interactions: Amiodarone (Pgp inhibitor) may increase the serum concentration of apixaban, monitor therapy.  Goal of Therapy:  Prevent stroke and systemic embolism d/t atrial fibrillation   Plan:   D/C Lovenox   Apixaban 2.5 mg PO BID  Follow up renal function, CBC  Patient education prior to discharge  Gretta Arab PharmD, BCPS Pager [phone] 09/12/2013 7:48 AM

## 2013-09-12 NOTE — Progress Notes (Addendum)
TELEMETRY: Reviewed telemetry pt in NSR. She had atrial fibrillation last night at 10 pm with rate 147 bpm. Asymptomatic. Converted spontaneously.  Filed Vitals:   09/11/13 0440 09/11/13 1418 09/11/13 2018 09/12/13 0520  BP: 122/36 109/41 127/41 121/53  Pulse: 75 81 87 82  Temp: 97.9 F (36.6 C) 98 F (36.7 C) 97.9 F (36.6 C) 98.4 F (36.9 C)  TempSrc: Oral Oral Oral Oral  Resp: 18 18 20 20   Height:      Weight:      SpO2: 100% 100% 98% 98%    Intake/Output Summary (Last 24 hours) at 09/12/13 0705 Last data filed at 09/12/13 0100  Gross per 24 hour  Intake    360 ml  Output      0 ml  Net    360 ml   Filed Weights   09/05/13 1711 09/08/13 1627 09/10/13 0540  Weight: 117 lb (53.071 kg) 128 lb 12 oz (58.4 kg) 131 lb 6.3 oz (59.6 kg)    Subjective Denies chest pain, SOB, or cough. Feels well.  Marland Kitchen amiodarone  400 mg Oral BID  . atorvastatin  10 mg Oral QHS  . docusate sodium  100 mg Oral BID  . enoxaparin (LOVENOX) injection  40 mg Subcutaneous Q24H  . senna  2 tablet Oral QHS  . sodium chloride  500 mL Intravenous Once  . sodium chloride  3 mL Intravenous Q12H      LABS: Basic Metabolic Panel:  Recent Labs  09/11/13 0420 09/12/13 0430  NA 133* 134*  K 3.7 4.0  CL 94* 94*  CO2 30 31  GLUCOSE 130* 117*  BUN 10 10  CREATININE 0.52 0.53  CALCIUM 7.9* 7.8*   Liver Function Tests: No results found for this basename: AST, ALT, ALKPHOS, BILITOT, PROT, ALBUMIN,  in the last 72 hours No results found for this basename: LIPASE, AMYLASE,  in the last 72 hours CBC:  Recent Labs  09/11/13 0420 09/12/13 0430  WBC 8.9 9.9  HGB 8.8* 8.2*  HCT 26.1* 24.5*  MCV 87.0 86.9  PLT 369 352   Cardiac Enzymes: No results found for this basename: CKTOTAL, CKMB, CKMBINDEX, TROPONINI,  in the last 72 hours BNP: No results found for this basename: PROBNP,  in the last 72 hours D-Dimer: No results found for this basename: DDIMER,  in the last 72 hours Hemoglobin  A1C: No results found for this basename: HGBA1C,  in the last 72 hours Fasting Lipid Panel: No results found for this basename: CHOL, HDL, LDLCALC, TRIG, CHOLHDL, LDLDIRECT,  in the last 72 hours Thyroid Function Tests: No results found for this basename: TSH, T4TOTAL, FREET3, T3FREE, THYROIDAB,  in the last 72 hours   Radiology/Studies:  No results found. Echo: Study Conclusions  - Left ventricle: The cavity size was normal. Systolic function was normal. The estimated ejection fraction was in the range of 60% to 65%. Wall motion was normal; there were no regional wall motion abnormalities. Doppler parameters are consistent with abnormal left ventricular relaxation (grade 1 diastolic dysfunction). Doppler parameters are consistent with elevated ventricular end-diastolic filling pressure. - Aortic valve: Trileaflet; mildly thickened, mildly calcified leaflets. There was no regurgitation. - Aortic root: The aortic root was normal in size. - Mitral valve: Structurally normal valve. There was mild regurgitation. - Right ventricle: Systolic function was normal. - Right atrium: The atrium was normal in size. - Pulmonic valve: There was mild regurgitation. - Pulmonary arteries: Systolic pressure was within the normal range. -  Inferior vena cava: The vessel was normal in size. - Pericardium, extracardiac: There was no pericardial effusion.    PHYSICAL EXAM General: Well developed, well nourished, in no acute distress. Head: Normocephalic, atraumatic, sclera non-icteric, oropharynx is clear Neck: Negative for carotid bruits. JVD not elevated. No adenopathy Lungs: Clear bilaterally to auscultation without wheezes, rales, or rhonchi. Breathing is unlabored. Heart: RRR S1 S2 without murmurs, rubs, or gallops.  Abdomen: Soft, non-tender, non-distended with normoactive bowel sounds.  No obvious abdominal masses. Extremities: No  edema.  Distal pedal pulses are 2+ and equal  bilaterally. Neuro: Alert and oriented X 3. Moves all extremities spontaneously.   ASSESSMENT AND PLAN: 1. Paroxysmal Afib/flutter. Recurrent atrial flutter 8/31 afternoon with LBBB aberrancy and atrial fibrillation last pm.   Arrhythmia probably related to stress of recent hip fracture/surgery and anemia. Echo shows normal LV function and normal atrial size. Given rapid rate will add beta blocker therapy. Continue oral amiodarone load. Patient has CHAD-Vasc score of 4 and should be anticoagulated to reduce risk of stroke. I would favor a NOAC and will ask pharmacy to transition from sq Lovenox to po Xarelto. I would observe on telemetry another day. She is post op day 7 so I think the risk of bleeding is low at this point. 2. Hypotension. Resolved. Amlodipine and losartan held. Hydrated. Will monitor as metoprolol added. 3. Hyponatremia. Improved with IV NS. 4. Left hip fracture s/p surgery. 5. COPD/emphysema. No evidence of PNA on CT. Fibrosis. No evidence of PE. 6. Post Op anemia. 7. History of HTN.    Present on Admission:  . Subcapital fracture of left hip . Hip fracture . HTN (hypertension) . Hyperlipidemia  Signed, Peter Martinique, [place] 09/12/2013 7:05 AM

## 2013-09-12 NOTE — Progress Notes (Addendum)
   Subjective: 7 Days Post-Op Procedure(s) (LRB): ARTHROPLASTY BIPOLAR HIP (Left) Patient reports pain as mild.   Patient seen in rounds for Dr. Wynelle Link. The patient states that she had another episode of tachycardia last night up into the 150's told to her by the nursing staff.  Plan was originally to go the SNF today but she states that she will be staying on for continued monitoring.  She did get up with therapy yesterday and the hip did okay with no cardiac complaints while up. Continue with therapy. Patient is well, and has had no acute complaints or problems Plan is to go Skilled nursing facility after hospital stay.  Objective: Vital signs in last 24 hours: Temp:  [97.9 F (36.6 C)-98.4 F (36.9 C)] 98.4 F (36.9 C) (09/03 0520) Pulse Rate:  [81-87] 82 (09/03 0520) Resp:  [18-20] 20 (09/03 0520) BP: (109-127)/(41-53) 121/53 mmHg (09/03 0520) SpO2:  [98 %-100 %] 98 % (09/03 0520)  Intake/Output from previous day:  Intake/Output Summary (Last 24 hours) at 09/12/13 0746 Last data filed at 09/12/13 0100  Gross per 24 hour  Intake    360 ml  Output      0 ml  Net    360 ml    Intake/Output this shift:    Labs:  Recent Labs  09/10/13 0336 09/11/13 0420 09/12/13 0430  HGB 8.4* 8.8* 8.2*    Recent Labs  09/11/13 0420 09/12/13 0430  WBC 8.9 9.9  RBC 3.00* 2.82*  HCT 26.1* 24.5*  PLT 369 352    Recent Labs  09/11/13 0420 09/12/13 0430  NA 133* 134*  K 3.7 4.0  CL 94* 94*  CO2 30 31  BUN 10 10  CREATININE 0.52 0.53  GLUCOSE 130* 117*  CALCIUM 7.9* 7.8*   No results found for this basename: LABPT, INR,  in the last 72 hours  EXAM General - Patient is Alert, Appropriate and Oriented Extremity - Neurovascular intact Sensation intact distally Dorsiflexion/Plantar flexion intact Dressing/Incision - clean, dry, no drainage, healing Motor Function - intact, moving foot and toes well on exam.   Past Medical History  Diagnosis Date  . Cancer     skin  cancer to left leg  . Hypertension     Assessment/Plan: 7 Days Post-Op Procedure(s) (LRB): ARTHROPLASTY BIPOLAR HIP (Left) Principal Problem:   Subcapital fracture of left hip Active Problems:   Hip fracture   HTN (hypertension)   Hyperlipidemia   Hyponatremia   Leukocytosis, unspecified   Postoperative anemia due to acute blood loss   Aspiration pneumonia   Atrial fibrillation with RVR   Wide-complex tachycardia  Estimated body mass index is 24.03 kg/(m^2) as calculated from the following:   Height as of this encounter: 5\' 2"  (1.575 m).   Weight as of this encounter: 59.6 kg (131 lb 6.3 oz). Up with therapy Discharge to SNF when stable  DVT Prophylaxis - Lovenox for a total of ten days and then switch to an aspirin 325 mg for four more weeks. Weight Bearing As Tolerated left Leg  Arlee Muslim, PA-C Orthopaedic Surgery 09/12/2013, 7:46 AM

## 2013-09-13 ENCOUNTER — Telehealth: Payer: Self-pay | Admitting: Cardiology

## 2013-09-13 LAB — BASIC METABOLIC PANEL
Anion gap: 10 (ref 5–15)
BUN: 9 mg/dL (ref 6–23)
CHLORIDE: 92 meq/L — AB (ref 96–112)
CO2: 30 mEq/L (ref 19–32)
CREATININE: 0.51 mg/dL (ref 0.50–1.10)
Calcium: 8 mg/dL — ABNORMAL LOW (ref 8.4–10.5)
GFR, EST NON AFRICAN AMERICAN: 86 mL/min — AB (ref >90)
Glucose, Bld: 115 mg/dL — ABNORMAL HIGH (ref 70–99)
Potassium: 4 mEq/L (ref 3.7–5.3)
Sodium: 132 mEq/L — ABNORMAL LOW (ref 137–147)

## 2013-09-13 LAB — CBC
HCT: 24.7 % — ABNORMAL LOW (ref 36.0–46.0)
Hemoglobin: 8.1 g/dL — ABNORMAL LOW (ref 12.0–15.0)
MCH: 29 pg (ref 26.0–34.0)
MCHC: 32.8 g/dL (ref 30.0–36.0)
MCV: 88.5 fL (ref 78.0–100.0)
PLATELETS: 419 10*3/uL — AB (ref 150–400)
RBC: 2.79 MIL/uL — ABNORMAL LOW (ref 3.87–5.11)
RDW: 15 % (ref 11.5–15.5)
WBC: 10 10*3/uL (ref 4.0–10.5)

## 2013-09-13 MED ORDER — FUROSEMIDE 10 MG/ML IJ SOLN
20.0000 mg | Freq: Once | INTRAMUSCULAR | Status: AC
  Administered 2013-09-13: 20 mg via INTRAVENOUS

## 2013-09-13 MED ORDER — FUROSEMIDE 10 MG/ML IJ SOLN
INTRAMUSCULAR | Status: AC
  Filled 2013-09-13: qty 2

## 2013-09-13 MED ORDER — METOPROLOL TARTRATE 25 MG PO TABS: 25.0000 mg | ORAL_TABLET | Freq: Two times a day (BID) | ORAL | Status: DC

## 2013-09-13 MED ORDER — HYDROCODONE-ACETAMINOPHEN 5-325 MG PO TABS: 1.0000 | ORAL_TABLET | ORAL | Status: DC | PRN

## 2013-09-13 MED ORDER — METHOCARBAMOL 500 MG PO TABS: 500.0000 mg | ORAL_TABLET | Freq: Four times a day (QID) | ORAL | Status: DC | PRN

## 2013-09-13 MED ORDER — AMIODARONE HCL 400 MG PO TABS: 400.0000 mg | ORAL_TABLET | Freq: Two times a day (BID) | ORAL | Status: DC

## 2013-09-13 MED ORDER — TRAMADOL HCL 50 MG PO TABS: 50.0000 mg | ORAL_TABLET | Freq: Four times a day (QID) | ORAL | Status: DC | PRN

## 2013-09-13 MED ORDER — APIXABAN 2.5 MG PO TABS: 2.5000 mg | ORAL_TABLET | Freq: Two times a day (BID) | ORAL | Status: DC

## 2013-09-13 NOTE — Progress Notes (Signed)
Patient is set to discharge to [facility] today. Patient & husband at bedside aware. Discharge packet in [place], [place] aware. [facility] [employer] authorization obtained - [facility] called for transport.   Clinical Social Work Department CLINICAL SOCIAL WORK PLACEMENT NOTE 09/13/2013  Patient:  Alexa Middleton, Alexa Middleton  Account Number:  [id number] Admit date:  09/05/2013  Clinical Social Worker:  Sueanne Margarita, LCSW  Date/time:  09/07/2013 02:00 PM  Clinical Social Work is seeking post-discharge placement for this patient at the following level of care:   [place]   (*CSW will update this form in Epic as items are completed)   09/07/2013  Patient/family provided with [place] Department of Clinical Social Work's list of facilities offering this level of care within the geographic area requested by the patient (or if unable, by the patient's family).  09/07/2013  Patient/family informed of their freedom to choose among providers that offer the needed level of care, that participate in Medicare, Medicaid or managed care program needed by the patient, have an available bed and are willing to accept the patient.  09/07/2013  Patient/family informed of [facility]' ownership interest in [facility], as well as of the fact that they are under no obligation to receive care at this facility.  PASARR submitted to EDS on 09/08/2013 PASARR number received on 09/08/2013  FL2 transmitted to all facilities in geographic area requested by pt/family on  09/07/2013 FL2 transmitted to all facilities within larger geographic area on   Patient informed that his/her managed care company has contracts with or will negotiate with  certain facilities, including the following:     Patient/family informed of bed offers received:  09/13/2013 Patient chooses bed at [facility] Physician recommends and patient chooses bed at    Patient to be transferred to [place] on   09/13/2013 Patient to be transferred to facility by [facility] Patient and family notified of transfer on 09/13/2013 Name of family member notified:  patient's husband, Alexa Middleton at bedside  The following physician request were entered in Epic:   Additional Comments:   Raynaldo Opitz, [place] Social Worker cell #: [phone]

## 2013-09-13 NOTE — Progress Notes (Signed)
Physical Therapy Treatment Patient Details Name: Alexa Middleton MRN: [id number] DOB: Jun 25, 1927 Today's Date: 09/13/2013    History of Present Illness 80 yo female adm after fall resulting in L hip fx, s/p L hip hemiarthroplasty. New onset Afib/Aflutter.  PMHx: HTN    PT Comments    Pt ambulated in hallway and SpO2 80% on room air upon return to recliner so reapplied 2L O2 [place].  Pt likely to d/c to SNF today.  Follow Up Recommendations  SNF     Equipment Recommendations  Rolling walker with 5" wheels    Recommendations for Other Services       Precautions / Restrictions Precautions Precautions: Posterior Hip;Fall Precaution Comments: pt able verbalize all precautions however still needs cues during mobility Restrictions LLE Weight Bearing: Weight bearing as tolerated    Mobility  Bed Mobility Overal bed mobility: Needs Assistance Bed Mobility: Supine to Sit     Supine to sit: Min guard     General bed mobility comments: pt self assisted L LE, verbal cues for maintaining precautions  Transfers Overall transfer level: Needs assistance Equipment used: Rolling walker (2 wheeled) Transfers: Sit to/from Stand Sit to Stand: Mod assist         General transfer comment: verbal cues for technique and maintaining precautions, assist to rise and stabilize, increased time, more assist today due to "stiff from being in the bed" per pt  Ambulation/Gait Ambulation/Gait assistance: Min assist Ambulation Distance (Feet): 40 Feet Assistive device: Rolling walker (2 wheeled) Gait Pattern/deviations: Step-to pattern;Antalgic Gait velocity: decr   General Gait Details: assist to steady, verbal cues for sequence, posture, turning toward unaffected side, increased time, pt with increased work of breathing upon return to recliner and SpO2 80% on room air so reapplied 2L O2 [place]    Stairs            Wheelchair Mobility    Modified Rankin (Stroke Patients Only)        Balance                                    Cognition Arousal/Alertness: Awake/alert Behavior During Therapy: WFL for tasks assessed/performed Overall Cognitive Status: Within Functional Limits for tasks assessed                      Exercises Total Joint Exercises Ankle Circles/Pumps: AROM;Both;10 reps Quad Sets: 10 reps;Both;AROM    General Comments        Pertinent Vitals/Pain Pain Assessment: 0-10 Pain Score: 2  Pain Location: L hip Pain Descriptors / Indicators: Sore Pain Intervention(s): Limited activity within patient's tolerance;Monitored during session;Repositioned SpO2 80% room air upon end of ambulation SpO2 95% on 2L O2 [place] after pursed lip breathing    Home Living                      Prior Function            PT Goals (current goals can now be found in the care plan section) Acute Rehab PT Goals PT Goal Formulation: With patient Time For Goal Achievement: 09/20/13 Potential to Achieve Goals: Good Progress towards PT goals: Progressing toward goals    Frequency  Min 3X/week    PT Plan Frequency needs to be updated    Co-evaluation             End of Session Equipment Utilized During  Treatment: Gait belt Activity Tolerance: Patient tolerated treatment well Patient left: in chair;with call bell/phone within reach     Time: 0908-0932 PT Time Calculation (min): 24 min  Charges:  $Gait Training: 23-37 mins                    G Codes:      Alexa Middleton,KATHrine E 09/28/13, 1:12 PM Carmelia Bake, PT, DPT 09/28/13 Pager: [phone]

## 2013-09-13 NOTE — Telephone Encounter (Signed)
Closed encounter °

## 2013-09-13 NOTE — Progress Notes (Signed)
   Subjective: 8 Days Post-Op Procedure(s) (LRB): ARTHROPLASTY BIPOLAR HIP (Left) Patient reports pain as mild.   Patient seen in rounds for Dr. Wynelle Link. Husband in room at the bedside. Patient is well, but has had some minor complaints of pain in the hip, requiring pain medications Patient is ready to go to [facility].  Objective: Vital signs in last 24 hours: Temp:  [98 F (36.7 C)-98.5 F (36.9 C)] 98.2 F (36.8 C) (09/04 0357) Pulse Rate:  [72-85] 72 (09/04 0357) Resp:  [18-24] 24 (09/04 0357) BP: (113-136)/(35-59) 136/47 mmHg (09/04 0357) SpO2:  [93 %-99 %] 99 % (09/04 0357)  Intake/Output from previous day:  Intake/Output Summary (Last 24 hours) at 09/13/13 1005 Last data filed at 09/13/13 0700  Gross per 24 hour  Intake    420 ml  Output    450 ml  Net    -30 ml    Labs:  Recent Labs  09/11/13 0420 09/12/13 0430 09/13/13 0536  HGB 8.8* 8.2* 8.1*    Recent Labs  09/12/13 0430 09/13/13 0536  WBC 9.9 10.0  RBC 2.82* 2.79*  HCT 24.5* 24.7*  PLT 352 419*    Recent Labs  09/12/13 0430 09/13/13 0536  NA 134* 132*  K 4.0 4.0  CL 94* 92*  CO2 31 30  BUN 10 9  CREATININE 0.53 0.51  GLUCOSE 117* 115*  CALCIUM 7.8* 8.0*   No results found for this basename: LABPT, INR,  in the last 72 hours  EXAM: General - Patient is Alert, Appropriate and Oriented Extremity - Neurovascular intact Sensation intact distally Dorsiflexion/Plantar flexion intact Incision - clean, dry Motor Function - intact, moving foot and toes well on exam.   Assessment/Plan: 8 Days Post-Op Procedure(s) (LRB): ARTHROPLASTY BIPOLAR HIP (Left) Procedure(s) (LRB): ARTHROPLASTY BIPOLAR HIP (Left) Past Medical History  Diagnosis Date  . Cancer     skin cancer to left leg  . Hypertension    Principal Problem:   Subcapital fracture of left hip Active Problems:   Hip fracture   HTN (hypertension)   Hyperlipidemia   Hyponatremia   Leukocytosis, unspecified   Postoperative  anemia due to acute blood loss   Aspiration pneumonia   Atrial fibrillation with RVR   Wide-complex tachycardia  Estimated body mass index is 24.03 kg/(m^2) as calculated from the following:   Height as of this encounter: 5\' 2"  (1.575 m).   Weight as of this encounter: 59.6 kg (131 lb 6.3 oz). Up with therapy Discharge to SNF Follow up - in 2 weeks after surgery Activity - WBAT to the left leg Disposition - Skilled nursing facility Condition Upon Discharge - Good D/C Meds - See DC Summary DVT Prophylaxis - patient was started on [place], PA-C Orthopaedic Surgery 09/13/2013, 10:05 AM

## 2013-09-13 NOTE — Progress Notes (Signed)
[facility] place and left message to give report, but no call back at this time.  Another nurse is taking over patient care, she is aware of patient leaving to [facility] this afternoon and can provide report when they call back.

## 2013-09-13 NOTE — Discharge Summary (Signed)
Physician Discharge Summary  Alexa Middleton:[id number] DOB: 08/04/1927 DOA: 09/05/2013  PCP: No primary provider on file.  Admit date: 09/05/2013 Discharge date: 09/13/2013  Time spent: 40 minutes  Recommendations for Outpatient Follow-up:  1. Followup with primary care physician within one week. 2. Followup with cardiology in 2-3 weeks, Dr. Martinique. 3. Continue amiodarone 400 mg twice a day for 7 more days and then switched to once a day.  Discharge Diagnoses:  Principal Problem:   Subcapital fracture of left hip Active Problems:   Hip fracture   HTN (hypertension)   Hyperlipidemia   Hyponatremia   Leukocytosis, unspecified   Postoperative anemia due to acute blood loss   Aspiration pneumonia   Atrial fibrillation with RVR   Wide-complex tachycardia   Discharge Condition: Stable  Diet recommendation: Heart healthy diet  Filed Weights   09/05/13 1711 09/08/13 1627 09/10/13 0540  Weight: 53.071 kg (117 lb) 58.4 kg (128 lb 12 oz) 59.6 kg (131 lb 6.3 oz)    History of present illness:  Alexa Middleton is a 80 y.o. female with PMH of HTN and HLP who presented with mechanical fall with left hip fracture. She underwent ORIF on 8/27 but her post-op course has been complicated by ectopic atrial tachycardia with intermittent atrial flutter/a-fib with RVR with rates in the 150-170s associated with mild hypotension to 90s. She has been started on amiodarone per cardiology.   Hospital Course:   Subcapital fracture of left hip s/p left hip hemiarthoplasty on 8/27 by Dr. Wynelle Link  - Cont PT/OT  - Lovenox discontinued as patient started on full anticoagulation for atrial fibrillation with Eliquis.  - Discharged to SNF for further rehabilitation.  Acute onset a-fib with RVR -Patient developed atrial fibrillation after the surgery, this is has been paroxysmal and recurrent. -ECHO: Preserved EF 60-65% with grade 1 DD, no significant valvular problems  -CT angio was done it was negative for  PE. -Cardiology consulted, started on amiodarone, patient was still having episodes of rapid ventricular response. -Cardiology added to metoprolol and Eliquis to the amiodarone, showed good control. -Followup with cardiology as outpatient in 2-3 weeks.  ? pneumonia  -CXR but CT chest was negative for pneumonia, antibiotics discontinued.  HTN (hypertension) -Blood pressure was in the low side after introduction of the amiodarone and metoprolol. -At the time of discharge previous blood pressure medications including losartan and amlodipine was discontinued.  Hyperlipidemia -Stable, continue statin.   Hyponatremia -Chronic stable hyponatremia, no intervention.   Mild leukocytosis  -Likely secondary to stress from the surgery. -Urine culture was negative and CT scan was negative for pneumonia.  Acute blood loss anemia  -Asymptomatic anemia, likely perioperative blood loss. -Patient presented with hemoglobin of 13.3 and discharge with hemoglobin of 8.1. -Follow hemoglobin closely as outpatient.  Hyperglycemia, likely stress-related from surgery. A1c 6.4.    Procedures:  Left hip hemiarthroplasty on 8/27.  Consultations:  Orthopedics.  Cardiology  Discharge Exam: Filed Vitals:   09/13/13 0357  BP: 136/47  Pulse: 72  Temp: 98.2 F (36.8 C)  Resp: 24   General: Alert and awake, oriented x3, not in any acute distress. HEENT: anicteric sclera, pupils reactive to light and accommodation, EOMI CVS: S1-S2 clear, no murmur rubs or gallops Chest: clear to auscultation bilaterally, no wheezing, rales or rhonchi Abdomen: soft nontender, nondistended, normal bowel sounds, no organomegaly Extremities: no cyanosis, clubbing or edema noted bilaterally Neuro: Cranial nerves II-XII intact, no focal neurological deficits  Discharge Instructions You were cared for by  a hospitalist during your hospital stay. If you have any questions about your discharge medications or the care you  received while you were in the hospital after you are discharged, you can call the unit and asked to speak with the hospitalist on call if the hospitalist that took care of you is not available. Once you are discharged, your primary care physician will handle any further medical issues. Please note that NO REFILLS for any discharge medications will be authorized once you are discharged, as it is imperative that you return to your primary care physician (or establish a relationship with a primary care physician if you do not have one) for your aftercare needs so that they can reassess your need for medications and monitor your lab values.  Discharge Instructions   Call MD / Call 911    Complete by:  As directed   If you experience chest pain or shortness of breath, CALL 911 and be transported to the hospital emergency room.  If you develope a fever above 101 F, pus (white drainage) or increased drainage or redness at the wound, or calf pain, call your surgeon's office.     Change dressing    Complete by:  As directed   You may change your dressing dressing daily with sterile 4 x 4 inch gauze dressing and paper tape.  Do not submerge the incision under water.     Constipation Prevention    Complete by:  As directed   Drink plenty of fluids.  Prune juice may be helpful.  You may use a stool softener, such as Colace (over the counter) 100 mg twice a day.  Use MiraLax (over the counter) for constipation as needed.     Diet - low sodium heart healthy    Complete by:  As directed      Discharge instructions    Complete by:  As directed   Pick up stool softner and laxative for home. Do not submerge incision under water. May shower. Continue to use ice for pain and swelling from surgery. Hip precautions.  Total Hip Protocol.  Take Lovenox injections for one more week and then switch over to a full dose enteric coated aspirin 325 mg daily for four more weeks.     Do not sit on low chairs, stoools or  toilet seats, as it may be difficult to get up from low surfaces    Complete by:  As directed      Driving restrictions    Complete by:  As directed   No driving until released by the physician.     Follow the hip precautions as taught in Physical Therapy    Complete by:  As directed      Increase activity slowly as tolerated    Complete by:  As directed      Increase activity slowly    Complete by:  As directed      Lifting restrictions    Complete by:  As directed   No lifting until released by the physician.     Patient may shower    Complete by:  As directed   You may shower without a dressing once there is no drainage.  Do not wash over the wound.  If drainage remains, do not shower until drainage stops.     TED hose    Complete by:  As directed   Use stockings (TED hose) for 3 weeks on both leg(s).  You may remove them at  night for sleeping.     Weight bearing as tolerated    Complete by:  As directed           Current Discharge Medication List    START taking these medications   Details  amiodarone (PACERONE) 400 MG tablet Take 1 tablet (400 mg total) by mouth 2 (two) times daily.    apixaban (ELIQUIS) 2.5 MG TABS tablet Take 1 tablet (2.5 mg total) by mouth 2 (two) times daily. Qty: 60 tablet    HYDROcodone-acetaminophen (NORCO/VICODIN) 5-325 MG per tablet Take 1-2 tablets by mouth every 4 (four) hours as needed for moderate pain or severe pain. Qty: 80 tablet, Refills: 0    methocarbamol (ROBAXIN) 500 MG tablet Take 1 tablet (500 mg total) by mouth every 6 (six) hours as needed for muscle spasms. Qty: 80 tablet, Refills: 0    metoprolol tartrate (LOPRESSOR) 25 MG tablet Take 1 tablet (25 mg total) by mouth 2 (two) times daily.    traMADol (ULTRAM) 50 MG tablet Take 1-2 tablets (50-100 mg total) by mouth every 6 (six) hours as needed (mild pain). Qty: 60 tablet, Refills: 1      CONTINUE these medications which have NOT CHANGED   Details  atorvastatin (LIPITOR)  10 MG tablet Take 10 mg by mouth daily at 6 PM.      STOP taking these medications     amLODipine (NORVASC) 10 MG tablet      losartan (COZAAR) 100 MG tablet        No Known Allergies Follow-up Information   Follow up with Gearlean Alf, MD. Schedule an appointment as soon as possible for a visit on 09/19/2013. (Call office at [phone] to set up follow up for this patient with Dr. Wynelle Link on Thursday 09/19/2013.)    Specialty:  Orthopedic Surgery   Contact information:   [address] [place] [address] [place] [address] [phone]       Follow up with Peter Martinique, MD. (The office will call you to make an appointment in 3 weeks with a NP or PA. If you do not hear from the office by tuesday afternoon please contact them)    Specialty:  Cardiology   Contact information:   [address] [place] [address] [phone]        The results of significant diagnostics from this hospitalization (including imaging, microbiology, ancillary and laboratory) are listed below for reference.    Significant Diagnostic Studies: Dg Chest 2 View  09/05/2013   CLINICAL DATA:  Preoperative hip surgery  EXAM: CHEST  2 VIEW  COMPARISON:  July 16, 2009  FINDINGS: Underlying emphysematous changes present. There is scarring in both upper lobes and base regions. There is no frank edema or consolidation. The heart size is within normal limits. Pulmonary vascularity reflects underlying emphysema. No adenopathy. There is atherosclerotic change in the aorta. No bone lesions.  IMPRESSION: Emphysema with areas of scarring bilaterally. No appreciable edema or consolidation.   Electronically Signed   By: Lowella Grip M.D.   On: 09/05/2013 15:53   Dg Elbow Complete Left  09/05/2013   CLINICAL DATA:  Status post fall.  EXAM: LEFT ELBOW - COMPLETE 3+ VIEW  COMPARISON:  None.  FINDINGS: There is no evidence of fracture, dislocation, or joint effusion. There is no evidence of arthropathy or  other focal bone abnormality. Soft tissues are unremarkable.  IMPRESSION: Negative exam.   Electronically Signed   By: Inge Rise M.D.   On: 09/05/2013  15:54   Dg Hip Complete Left  09/05/2013   CLINICAL DATA:  Status post fall.  Left hip pain.  EXAM: LEFT HIP - COMPLETE 2+ VIEW  COMPARISON:  None.  FINDINGS: The patient has an acute subcapital fracture of the left hip. No other acute bony or joint abnormality is identified. Lower lumbar degenerative change is noted.  IMPRESSION: Acute subcapital fracture left hip.   Electronically Signed   By: Inge Rise M.D.   On: 09/05/2013 15:59   Dg Knee 1-2 Views Left  09/05/2013   CLINICAL DATA:  Pain.  EXAM: LEFT KNEE - 1-2 VIEW  COMPARISON:  None.  FINDINGS: Soft tissue structures are unremarkable. Mild tricompartment degenerative change. No evidence of fracture or dislocation .  IMPRESSION: Mild tricompartment degenerative change.  No acute abnormality.   Electronically Signed   By: [place]   On: 09/05/2013 15:54   Ct Angio Chest Pe W/cm &/or Wo Cm  09/08/2013   CLINICAL DATA:  Elevated D-dimer. Hypotension, tachycardia. Atrial fib.  EXAM: CT ANGIOGRAPHY CHEST WITH CONTRAST  TECHNIQUE: Multidetector CT imaging of the chest was performed using the standard protocol during bolus administration of intravenous contrast. Multiplanar CT image reconstructions and MIPs were obtained to evaluate the vascular anatomy.  CONTRAST:  166mL OMNIPAQUE IOHEXOL 350 MG/ML SOLN  COMPARISON:  Chest x-ray earlier today.  FINDINGS: No filling defects in the pulmonary arteries to suggest pulmonary emboli. Small bilateral pleural effusions. Heart is normal size. Aorta is normal caliber. Coronary artery and aortic calcifications noted. No mediastinal, hilar, or axillary adenopathy.  Moderate paraseptal emphysema. Peripheral subpleural reticulation and interstitial thickening compatible with fibrosis, most pronounced in the lung bases. Airspace opacity at the left base  likely related to scarring. No other confluent airspace opacities.  Imaging into the upper abdomen shows no acute findings.  No acute bony abnormality.  Review of the MIP images confirms the above findings.  IMPRESSION: COPD, fibrosis.  Opacity at the left lung base likely related to scarring/ fibrosis.  Small bilateral effusions.   Electronically Signed   By: Rolm Baptise M.D.   On: 09/08/2013 18:35   Dg Pelvis Portable  09/06/2013   CLINICAL DATA:  Postoperative  EXAM: PORTABLE PELVIS 1-2 VIEWS  COMPARISON:  Abdomen for 01/2005  FINDINGS: Interval left hip arthroplasty with cemented femoral component. Component appears well seated. Pelvis appears intact. No displaced fractures identified. Soft tissue gas consistent with recent surgery.  IMPRESSION: Left hip arthroplasty with component appearing well-seated.   Electronically Signed   By: Lucienne Capers M.D.   On: 09/06/2013 00:57   Dg Chest Port 1 View  09/08/2013   CLINICAL DATA:  80 year old female with leukocytosis status post surgery. Left hip arthroplasty. Initial encounter.  EXAM: PORTABLE CHEST - 1 VIEW  COMPARISON:  09/05/2013 and earlier.  FINDINGS: Portable AP semi upright view at 0944 hrs. Coarse bilateral pulmonary opacity. Stable lung volumes. Mildly increased left lung base opacity compared to prior Normal cardiac size and mediastinal contours. Visualized tracheal air column is within normal limits.  IMPRESSION: Chronic lung disease with superimposed patchy left lung base opacity suspicious for acute infectious exacerbation in this setting.   Electronically Signed   By: Lars Pinks M.D.   On: 09/08/2013 09:55    Microbiology: Recent Results (from the past 240 hour(s))  SURGICAL PCR SCREEN     Status: None   Collection Time    09/05/13  5:58 PM      Result Value Ref Range Status  MRSA, PCR NEGATIVE  NEGATIVE Final   Staphylococcus aureus NEGATIVE  NEGATIVE Final   Comment:            The Xpert SA Assay (FDA     approved for NASAL  specimens     in patients over 51 years of age),     is one component of     a comprehensive surveillance     program.  Test performance has     been validated by [employer] for patients greater     than or equal to 70 year old.     It is not intended     to diagnose infection nor to     guide or monitor treatment.  MRSA PCR SCREENING     Status: None   Collection Time    09/08/13  4:52 PM      Result Value Ref Range Status   MRSA by PCR NEGATIVE  NEGATIVE Final   Comment:            The GeneXpert MRSA Assay (FDA     approved for NASAL specimens     only), is one component of a     comprehensive MRSA colonization     surveillance program. It is not     intended to diagnose MRSA     infection nor to guide or     monitor treatment for     MRSA infections.     Labs: Basic Metabolic Panel:  Recent Labs Lab 09/07/13 0442 09/08/13 1454 09/09/13 0232 09/10/13 0336 09/11/13 0420 09/12/13 0430 09/13/13 0536  NA 137 128* 132* 131* 133* 134* 132*  K 4.4 4.2 4.0 3.8 3.7 4.0 4.0  CL 99 90* 94* 94* 94* 94* 92*  CO2 28 26 28 28 30 31 30   GLUCOSE 133* 172* 105* 112* 130* 117* 115*  BUN 13 16 13 10 10 10 9   CREATININE 0.50 0.56 0.54 0.52 0.52 0.53 0.51  CALCIUM 8.5 7.9* 7.7* 7.5* 7.9* 7.8* 8.0*  MG  --  2.0  --   --   --   --   --   PHOS  --  3.1  --   --   --   --   --    Liver Function Tests: No results found for this basename: AST, ALT, ALKPHOS, BILITOT, PROT, ALBUMIN,  in the last 168 hours No results found for this basename: LIPASE, AMYLASE,  in the last 168 hours No results found for this basename: AMMONIA,  in the last 168 hours CBC:  Recent Labs Lab 09/09/13 0232 09/10/13 0336 09/11/13 0420 09/12/13 0430 09/13/13 0536  WBC 11.7* 10.4 8.9 9.9 10.0  HGB 8.1* 8.4* 8.8* 8.2* 8.1*  HCT 24.3* 24.9* 26.1* 24.5* 24.7*  MCV 86.8 87.4 87.0 86.9 88.5  PLT 250 275 369 352 419*   Cardiac Enzymes:  Recent Labs Lab 09/08/13 1454 09/08/13 2009 09/09/13 0232   TROPONINI <0.30 <0.30 <0.30   BNP: BNP (last 3 results) No results found for this basename: PROBNP,  in the last 8760 hours CBG: No results found for this basename: GLUCAP,  in the last 168 hours     Signed:  [facility] A  [facility] 09/13/2013, 10:43 AM

## 2013-09-13 NOTE — Progress Notes (Signed)
TELEMETRY: Reviewed telemetry pt in NSR. No further Afib. Few beats of LBBB aberrancy.  Filed Vitals:   09/12/13 1324 09/12/13 1335 09/12/13 2108 09/13/13 0357  BP: 113/35 117/35 114/59 136/47  Pulse: 75  85 72  Temp: 98 F (36.7 C)  98.5 F (36.9 C) 98.2 F (36.8 C)  TempSrc: Oral  Oral Oral  Resp: 18  20 24   Height:      Weight:      SpO2: 94%  93% 99%    Intake/Output Summary (Last 24 hours) at 09/13/13 0734 Last data filed at 09/13/13 0700  Gross per 24 hour  Intake    543 ml  Output    450 ml  Net     93 ml   Filed Weights   09/05/13 1711 09/08/13 1627 09/10/13 0540  Weight: 117 lb (53.071 kg) 128 lb 12 oz (58.4 kg) 131 lb 6.3 oz (59.6 kg)    Subjective Denies chest pain, SOB, or cough. Feels well.  Marland Kitchen amiodarone  400 mg Oral BID  . apixaban  2.5 mg Oral BID  . docusate sodium  100 mg Oral BID  . metoprolol tartrate  25 mg Oral BID  . senna  2 tablet Oral QHS  . simvastatin  20 mg Oral q1800  . sodium chloride  500 mL Intravenous Once  . sodium chloride  3 mL Intravenous Q12H      LABS: Basic Metabolic Panel:  Recent Labs  09/12/13 0430 09/13/13 0536  NA 134* 132*  K 4.0 4.0  CL 94* 92*  CO2 31 30  GLUCOSE 117* 115*  BUN 10 9  CREATININE 0.53 0.51  CALCIUM 7.8* 8.0*   Liver Function Tests: No results found for this basename: AST, ALT, ALKPHOS, BILITOT, PROT, ALBUMIN,  in the last 72 hours No results found for this basename: LIPASE, AMYLASE,  in the last 72 hours CBC:  Recent Labs  09/12/13 0430 09/13/13 0536  WBC 9.9 10.0  HGB 8.2* 8.1*  HCT 24.5* 24.7*  MCV 86.9 88.5  PLT 352 419*   Cardiac Enzymes: No results found for this basename: CKTOTAL, CKMB, CKMBINDEX, TROPONINI,  in the last 72 hours BNP: No results found for this basename: PROBNP,  in the last 72 hours D-Dimer: No results found for this basename: DDIMER,  in the last 72 hours Hemoglobin A1C: No results found for this basename: HGBA1C,  in the last 72 hours Fasting  Lipid Panel: No results found for this basename: CHOL, HDL, LDLCALC, TRIG, CHOLHDL, LDLDIRECT,  in the last 72 hours Thyroid Function Tests: No results found for this basename: TSH, T4TOTAL, FREET3, T3FREE, THYROIDAB,  in the last 72 hours   Radiology/Studies:  No results found. Echo: Study Conclusions  - Left ventricle: The cavity size was normal. Systolic function was normal. The estimated ejection fraction was in the range of 60% to 65%. Wall motion was normal; there were no regional wall motion abnormalities. Doppler parameters are consistent with abnormal left ventricular relaxation (grade 1 diastolic dysfunction). Doppler parameters are consistent with elevated ventricular end-diastolic filling pressure. - Aortic valve: Trileaflet; mildly thickened, mildly calcified leaflets. There was no regurgitation. - Aortic root: The aortic root was normal in size. - Mitral valve: Structurally normal valve. There was mild regurgitation. - Right ventricle: Systolic function was normal. - Right atrium: The atrium was normal in size. - Pulmonic valve: There was mild regurgitation. - Pulmonary arteries: Systolic pressure was within the normal range. - Inferior vena cava: The vessel  was normal in size. - Pericardium, extracardiac: There was no pericardial effusion.    PHYSICAL EXAM General: Well developed, well nourished, in no acute distress. Head: Normocephalic, atraumatic, sclera non-icteric, oropharynx is clear Neck: Negative for carotid bruits. JVD not elevated. No adenopathy Lungs: Clear bilaterally to auscultation without wheezes, rales, or rhonchi. Breathing is unlabored. Heart: RRR S1 S2 without murmurs, rubs, or gallops.  Abdomen: Soft, non-tender, non-distended with normoactive bowel sounds.  No obvious abdominal masses. Extremities: No  edema.  Distal pedal pulses are 2+ and equal bilaterally. Neuro: Alert and oriented X 3. Moves all extremities spontaneously.   ASSESSMENT  AND PLAN: 1. Paroxysmal Afib/flutter. Recurrent atrial flutter 8/31 afternoon with LBBB aberrancy and atrial fibrillation 09/11/13.   Arrhythmia probably related to stress of recent hip fracture/surgery and anemia. Echo shows normal LV function and normal atrial size. Appears to be well controlled on metoprolol and amiodarone. Patient has CHAD-Vasc score of 4 and should be anticoagulated to reduce risk of stroke. Now on Eliquis. 2. Hypotension. Resolved. Amlodipine and losartan held. Hydrated. Controlled on metoprolol only now. 3. Hyponatremia. Improved with IV NS. 4. Left hip fracture s/p surgery. 5. COPD/emphysema. No evidence of PNA on CT. Fibrosis. No evidence of PE. 6. Post Op anemia. 7. History of HTN.  Patient is stable for DC to Rehab facility from a cardiac standpoint. Would continue BID amiodarone for one week more then 400 mg daily. Will arrange follow up in our office in 3 weeks.  Present on Admission:  . Subcapital fracture of left hip . Hip fracture . HTN (hypertension) . Hyperlipidemia . Hyponatremia . Leukocytosis, unspecified . Postoperative anemia due to acute blood loss . Aspiration pneumonia . Atrial fibrillation with RVR  Signed, Peter Martinique, [place] 09/13/2013 7:34 AM

## 2013-09-17 ENCOUNTER — Non-Acute Institutional Stay (SKILLED_NURSING_FACILITY): Payer: Medicare Other | Admitting: Internal Medicine

## 2013-09-17 ENCOUNTER — Telehealth: Payer: Self-pay | Admitting: Cardiology

## 2013-09-17 DIAGNOSIS — I480 Paroxysmal atrial fibrillation: Secondary | ICD-10-CM

## 2013-09-17 DIAGNOSIS — I4891 Unspecified atrial fibrillation: Secondary | ICD-10-CM

## 2013-09-17 DIAGNOSIS — S72009S Fracture of unspecified part of neck of unspecified femur, sequela: Secondary | ICD-10-CM

## 2013-09-17 DIAGNOSIS — D62 Acute posthemorrhagic anemia: Secondary | ICD-10-CM

## 2013-09-17 DIAGNOSIS — S72012S Unspecified intracapsular fracture of left femur, sequela: Secondary | ICD-10-CM

## 2013-09-17 DIAGNOSIS — I10 Essential (primary) hypertension: Secondary | ICD-10-CM

## 2013-09-17 NOTE — Telephone Encounter (Signed)
Returned call to [employer] spoke to patient's nurse Ramattou she stated she was left a note to call and schedule patient appointment this week with Dr.Jordan.Stated she has post hospital appt 10/09/13 but needs to be seen sooner.Stated patient has been having weeping in both lower legs.Appointment scheduled with Truitt Merle NP 09/18/13 at 9:30 am.

## 2013-09-17 NOTE — Telephone Encounter (Signed)
See previous 09/17/13 note.

## 2013-09-17 NOTE — Telephone Encounter (Signed)
Janett Billow Therapist, nutritional) from [facility] returned call. NP recommended a sooner appmt with Dr. Martinique (this week if possible). Patient has new weeping from bilateral LE. Patient wears TED hose. Patient DOES NOT have SOB, wheezing, crackles. Patient is NON-COMPLAINT with keeping legs elevated. Her legs are being wrapped in Kerlix. O2 sats are OK.   Patient has appmt on 09/18/13

## 2013-09-17 NOTE — Progress Notes (Signed)
HISTORY & PHYSICAL  DATE: 09/17/2013   FACILITY: [facility]  LEVEL OF CARE: SNF (31)  ALLERGIES:  No Known Allergies  CHIEF COMPLAINT:  Manage left hip fracture, acute blood loss anemia and atrial fibrillation  HISTORY OF PRESENT ILLNESS: Patient is an 80 year old Caucasian female.  HIP FRACTURE: The patient had a mechanical fall and sustained a femur fracture.  Patient subsequently underwent surgical repair and tolerated the procedure well. Patient is admitted to this facility for short-term rehabilitation. Patient denies hip pain currently. No complications reported from the pain medications currently being used.  ATRIAL FIBRILLATION: the patients atrial fibrillation remains stable.  The patient denies DOE, tachycardia, orthopnea, transient neurological sx, palpitations, & PNDs.  No complications noted from the medications currently being used. Complains of bilateral lower extremity swelling.  ANEMIA: The anemia has been stable. The patient denies fatigue, melena or hematochezia. No complications from the medications currently being used. Postoperatively the patient suffered an acute blood loss anemia. Hemoglobin dropped from 13.3 to 8.1. She did not require transfusion.  PAST MEDICAL HISTORY :  Past Medical History  Diagnosis Date  . Cancer     skin cancer to left leg  . Hypertension     PAST SURGICAL HISTORY: Past Surgical History  Procedure Laterality Date  . Cholecystectomy    . Abdominal hysterectomy    . Hip arthroplasty Left 09/05/2013    Procedure: ARTHROPLASTY BIPOLAR HIP;  Surgeon: Gearlean Alf, MD;  Location: [facility];  Service: Orthopedics;  Laterality: Left;    SOCIAL HISTORY:  reports that she quit smoking about 45 years ago. She has never used smokeless tobacco. She reports that she drinks about 4.2 ounces of alcohol per week. She reports that she does not use illicit drugs.  FAMILY HISTORY:  Family History  Problem Relation  Age of Onset  . CAD Father     died of MI at age 47    CURRENT MEDICATIONS: Reviewed per MAR/see medication list  REVIEW OF SYSTEMS:  See HPI otherwise 14 point ROS is negative.  PHYSICAL EXAMINATION  VS:  See VS section  GENERAL: no acute distress, normal body habitus EYES: conjunctivae normal, sclerae normal, normal eye lids MOUTH/THROAT: lips without lesions,no lesions in the mouth,tongue is without lesions,uvula elevates in midline NECK: supple, trachea midline, no neck masses, no thyroid tenderness, no thyromegaly LYMPHATICS: no LAN in the neck, no supraclavicular LAN RESPIRATORY: breathing is even & unlabored, BS CTAB CARDIAC: RRR, no murmur,no extra heart sounds, +2 bilateral lower extremity edema GI:  ABDOMEN: abdomen soft, normal BS, no masses, no tenderness  LIVER/SPLEEN: no hepatomegaly, no splenomegaly MUSCULOSKELETAL: HEAD: normal to inspection  EXTREMITIES: LEFT UPPER EXTREMITY: full range of motion, normal strength & tone RIGHT UPPER EXTREMITY:  full range of motion, normal strength & tone LEFT LOWER EXTREMITY:  range of motion not tested due to surgery, normal strength & tone RIGHT LOWER EXTREMITY:  Moderate range of motion, normal strength & tone PSYCHIATRIC: the patient is alert & oriented to person, affect & behavior appropriate  LABS/RADIOLOGY:  Labs reviewed: Basic Metabolic Panel:  Recent Labs  09/07/13 0442 09/08/13 1454  09/11/13 0420 09/12/13 0430 09/13/13 0536  NA 137 128*  < > 133* 134* 132*  K 4.4 4.2  < > 3.7 4.0 4.0  CL 99 90*  < > 94* 94* 92*  CO2 28 26  < > 30 31 30   GLUCOSE 133* 172*  < > 130* 117*  115*  BUN 13 16  < > 10 10 9   CREATININE 0.50 0.56  < > 0.52 0.53 0.51  CALCIUM 8.5 7.9*  < > 7.9* 7.8* 8.0*  MG  --  2.0  --   --   --   --   PHOS  --  3.1  --   --   --   --   < > = values in this interval not displayed.  CBC:  Recent Labs  09/05/13 1413  09/11/13 0420 09/12/13 0430 09/13/13 0536  WBC 10.1  < > 8.9 9.9 10.0    NEUTROABS 8.4*  --   --   --   --   HGB 13.3  < > 8.8* 8.2* 8.1*  HCT 39.5  < > 26.1* 24.5* 24.7*  MCV 86.8  < > 87.0 86.9 88.5  PLT 308  < > 369 352 419*  < > = values in this interval not displayed.  Cardiac Enzymes:  Recent Labs  09/08/13 1454 09/08/13 2009 09/09/13 0232  TROPONINI <0.30 <0.30 <0.30    Transthoracic Echocardiography  Patient:    Esperansa, Sarabia MR #:       [id number] Study Date: 09/09/2013 Gender:     F Age:        75 Height:     157.5 cm Weight:     58.1 kg BSA:        1.6 m^2 Pt. Status: Room:       [facility]  ATTENDING    [address] [id number]  Barrie Folk [id number]  SONOGRAPHER  Donata Clay  PERFORMING   [facility], Inpatient  cc:  ------------------------------------------------------------------- LV EF: 60% -   65%  ------------------------------------------------------------------- Indications:      Atrial fibrillation - 427.31.  ------------------------------------------------------------------- History:   Risk factors:  Hypertension. Dyslipidemia.  ------------------------------------------------------------------- Study Conclusions  - Left ventricle: The cavity size was normal. Systolic function was   normal. The estimated ejection fraction was in the range of 60%   to 65%. Wall motion was normal; there were no regional wall   motion abnormalities. Doppler parameters are consistent with   abnormal left ventricular relaxation (grade 1 diastolic   dysfunction). Doppler parameters are consistent with elevated   ventricular end-diastolic filling pressure. - Aortic valve: Trileaflet; mildly thickened, mildly calcified   leaflets. There was no regurgitation. - Aortic root: The aortic root was normal in size. - Mitral valve: Structurally normal valve. There was mild   regurgitation. - Right ventricle: Systolic function was normal. - Right atrium: The  atrium was normal in size. - Pulmonic valve: There was mild regurgitation. - Pulmonary arteries: Systolic pressure was within the normal   range. - Inferior vena cava: The vessel was normal in size. - Pericardium, extracardiac: There was no pericardial effusion.  Transthoracic echocardiography.  M-mode, complete 2D, spectral Doppler, and color Doppler.  Birthdate:  Patient birthdate: [birth year].  Age:  Patient is 80 yr old.  Sex:  Gender: female. BMI: 23.4 kg/m^2.  Blood pressure:     114/35  Patient status: Inpatient.  Study date:  Study date: 09/09/2013. Study time: 04:00 PM.  Location:  ICU/CCU  -------------------------------------------------------------------  ------------------------------------------------------------------- Left ventricle:  The cavity size was normal. Systolic function was normal. The estimated ejection fraction was in the range of 60% to 65%. Wall motion was normal; there were no regional wall motion abnormalities.  The transmitral flow pattern was normal. The deceleration time of the early transmitral flow velocity was normal. The pulmonary vein flow pattern was normal. The tissue Doppler parameters were normal. Doppler parameters are consistent with abnormal left ventricular relaxation (grade 1 diastolic dysfunction). Doppler parameters are consistent with elevated ventricular end-diastolic filling pressure.  ------------------------------------------------------------------- Aortic valve:   Trileaflet; mildly thickened, mildly calcified leaflets. Mobility was not restricted.  Doppler:  Transvalvular velocity was within the normal range. There was no stenosis. There was no regurgitation.  ------------------------------------------------------------------- Aorta:  Aortic root: The aortic root was normal in size.  ------------------------------------------------------------------- Mitral valve:   Structurally normal valve.   Mobility was  not restricted.  Doppler:  Transvalvular velocity was within the normal range. There was no evidence for stenosis. There was mild regurgitation.    Peak gradient (D): 5 mm Hg.  ------------------------------------------------------------------- Left atrium:  The atrium was normal in size.  ------------------------------------------------------------------- Right ventricle:  The cavity size was normal. Wall thickness was normal. Systolic function was normal.  ------------------------------------------------------------------- Pulmonic valve:    Structurally normal valve.   Cusp separation was normal.  Doppler:  Transvalvular velocity was within the normal range. There was no evidence for stenosis. There was mild regurgitation.  ------------------------------------------------------------------- Tricuspid valve:   Structurally normal valve.    Doppler: Transvalvular velocity was within the normal range. There was no regurgitation.  ------------------------------------------------------------------- Pulmonary artery:   The main pulmonary artery was normal-sized. Systolic pressure was within the normal range.  ------------------------------------------------------------------- Right atrium:  The atrium was normal in size.  ------------------------------------------------------------------- Pericardium:  There was no pericardial effusion.  ------------------------------------------------------------------- Systemic veins: Inferior vena cava: The vessel was normal in size.  ------------------------------------------------------------------- Measurements   Left ventricle                         Value        Reference  LV ID, ED, PLAX chordal        (L)     41.2  mm     43 - 52  LV ID, ES, PLAX chordal                27.3  mm     23 - 38  LV fx shortening, PLAX chordal         34    %      >=29  LV PW thickness, ED                    6.78  mm     ---------  IVS/LV PW ratio, ED                     0.97         <=1.3  LV e&', lateral                         9.79  cm/s   ---------  LV E/e&', lateral                       11.95        ---------  LV e&', medial                          7.94  cm/s   ---------  LV E/e&', medial  14.74        ---------  LV e&', average                         8.87  cm/s   ---------  LV E/e&', average                       13.2         ---------    Ventricular septum                     Value        Reference  IVS thickness, ED                      6.56  mm     ---------    Aorta                                  Value        Reference  Aortic root ID, ED                     30    mm     ---------    Left atrium                            Value        Reference  LA ID, A-P, ES                         40    mm     ---------  LA ID/bsa, A-P                 (H)     2.5   cm/m^2 <=2.2    Mitral valve                           Value        Reference  Mitral E-wave peak velocity            117   cm/s   ---------  Mitral A-wave peak velocity            98    cm/s   ---------  Mitral deceleration time               177   ms     150 - 230  Mitral peak gradient, D                5     mm Hg  ---------  Mitral E/A ratio, peak                 1.2          ---------    Systemic veins                         Value        Reference  Estimated CVP                          8     mm Hg  ---------    Right ventricle  Value        Reference  RV s&', lateral, S                      13    cm/s   ---------    Pulmonic valve                         Value        Reference  Pulmonic regurg velocity, ED           163   cm/s   ---------  Pulmonic regurg gradient, ED           11    mm Hg  ---------  LEFT KNEE - 1-2 VIEW   COMPARISON:  None.   FINDINGS: Soft tissue structures are unremarkable. Mild tricompartment degenerative change. No evidence of fracture or dislocation .   IMPRESSION: Mild tricompartment  degenerative change.  No acute abnormality.     CHEST  2 VIEW   COMPARISON:  July 16, 2009   FINDINGS: Underlying emphysematous changes present. There is scarring in both upper lobes and base regions. There is no frank edema or consolidation. The heart size is within normal limits. Pulmonary vascularity reflects underlying emphysema. No adenopathy. There is atherosclerotic change in the aorta. No bone lesions.   IMPRESSION: Emphysema with areas of scarring bilaterally. No appreciable edema or consolidation. LEFT ELBOW - COMPLETE 3+ VIEW   COMPARISON:  None.   FINDINGS: There is no evidence of fracture, dislocation, or joint effusion. There is no evidence of arthropathy or other focal bone abnormality. Soft tissues are unremarkable.   IMPRESSION: Negative exam.   LEFT HIP - COMPLETE 2+ VIEW   COMPARISON:  None.   FINDINGS: The patient has an acute subcapital fracture of the left hip. No other acute bony or joint abnormality is identified. Lower lumbar degenerative change is noted.   IMPRESSION: Acute subcapital fracture left hip. PORTABLE PELVIS 1-2 VIEWS   COMPARISON:  Abdomen for 01/2005   FINDINGS: Interval left hip arthroplasty with cemented femoral component. Component appears well seated. Pelvis appears intact. No displaced fractures identified. Soft tissue gas consistent with recent surgery.   IMPRESSION: Left hip arthroplasty with component appearing well-seated.   PORTABLE CHEST - 1 VIEW   COMPARISON:  09/05/2013 and earlier.   FINDINGS: Portable AP semi upright view at 0944 hrs. Coarse bilateral pulmonary opacity. Stable lung volumes. Mildly increased left lung base opacity compared to prior Normal cardiac size and mediastinal contours. Visualized tracheal air column is within normal limits.   IMPRESSION: Chronic lung disease with superimposed patchy left lung base opacity suspicious for acute infectious exacerbation in this setting.       EXAM: CT ANGIOGRAPHY CHEST WITH CONTRAST   TECHNIQUE: Multidetector CT imaging of the chest was performed using the standard protocol during bolus administration of intravenous contrast. Multiplanar CT image reconstructions and MIPs were obtained to evaluate the vascular anatomy.   CONTRAST:  148mL OMNIPAQUE IOHEXOL 350 MG/ML SOLN   COMPARISON:  Chest x-ray earlier today.   FINDINGS: No filling defects in the pulmonary arteries to suggest pulmonary emboli. Small bilateral pleural effusions. Heart is normal size. Aorta is normal caliber. Coronary artery and aortic calcifications noted. No mediastinal, hilar, or axillary adenopathy.   Moderate paraseptal emphysema. Peripheral subpleural reticulation and interstitial thickening compatible with fibrosis, most pronounced in the lung bases. Airspace opacity at the left base likely related to scarring. No other confluent  airspace opacities.   Imaging into the upper abdomen shows no acute findings.   No acute bony abnormality.   Review of the MIP images confirms the above findings.   IMPRESSION: COPD, fibrosis.   Opacity at the left lung base likely related to scarring/ fibrosis.   Small bilateral effusions.    ASSESSMENT/PLAN:  Left hip fracture-status post ORIF. Continue rehabilitation. Atrial fibrillation-rate controlled Acute blood loss anemia-recheck hemoglobin  Hypertension-well-controlled Hyperlipidemia-continue Lipitor Check CBC  I have reviewed patient's medical records received at admission/from hospitalization.  CPT CODE: 14481  Leylah Tarnow Y Anjalee Cope, [place] [phone]

## 2013-09-17 NOTE — Telephone Encounter (Signed)
Returned call to [employer] - left message for Janett Billow Therapist, nutritional) to return call.   Message forwarded to [facility], LPN - Dr. Doug Sou nurse

## 2013-09-17 NOTE — Telephone Encounter (Signed)
Alexa Middleton called in stating that both of the pt's lower extremities are weeping and would like to see if the pt could be seen sooner that 9/30 with Dr. Martinique or an extender. Please call  Thanks

## 2013-09-18 ENCOUNTER — Ambulatory Visit (INDEPENDENT_AMBULATORY_CARE_PROVIDER_SITE_OTHER): Payer: Medicare Other | Admitting: Nurse Practitioner

## 2013-09-18 ENCOUNTER — Encounter: Payer: Self-pay | Admitting: Nurse Practitioner

## 2013-09-18 VITALS — BP 120/60 | HR 55 | Ht 62.0 in | Wt 123.4 lb

## 2013-09-18 DIAGNOSIS — R609 Edema, unspecified: Secondary | ICD-10-CM

## 2013-09-18 DIAGNOSIS — Z79899 Other long term (current) drug therapy: Secondary | ICD-10-CM

## 2013-09-18 DIAGNOSIS — I4891 Unspecified atrial fibrillation: Secondary | ICD-10-CM

## 2013-09-18 DIAGNOSIS — Z7901 Long term (current) use of anticoagulants: Secondary | ICD-10-CM

## 2013-09-18 DIAGNOSIS — I480 Paroxysmal atrial fibrillation: Secondary | ICD-10-CM

## 2013-09-18 LAB — BASIC METABOLIC PANEL
BUN: 9 mg/dL (ref 6–23)
CO2: 30 mEq/L (ref 19–32)
Calcium: 8 mg/dL — ABNORMAL LOW (ref 8.4–10.5)
Chloride: 84 mEq/L — ABNORMAL LOW (ref 96–112)
Creatinine, Ser: 0.5 mg/dL (ref 0.4–1.2)
GFR: 114.27 mL/min (ref >60.00)
Glucose, Bld: 127 mg/dL — ABNORMAL HIGH (ref 70–99)
Potassium: 4.5 mEq/L (ref 3.5–5.1)
Sodium: 121 mEq/L — CL (ref 135–145)

## 2013-09-18 LAB — HEPATIC FUNCTION PANEL
ALT: 64 U/L — ABNORMAL HIGH (ref 0–35)
AST: 66 U/L — ABNORMAL HIGH (ref 0–37)
Albumin: 3.3 g/dL — ABNORMAL LOW (ref 3.5–5.2)
Alkaline Phosphatase: 88 U/L (ref 39–117)
Bilirubin, Direct: 0.1 mg/dL (ref 0.0–0.3)
Total Bilirubin: 0.6 mg/dL (ref 0.2–1.2)
Total Protein: 6.4 g/dL (ref 6.0–8.3)

## 2013-09-18 LAB — CBC
HCT: 30.1 % — ABNORMAL LOW (ref 36.0–46.0)
Hemoglobin: 10.2 g/dL — ABNORMAL LOW (ref 12.0–15.0)
MCHC: 34 g/dL (ref 30.0–36.0)
MCV: 88.4 fl (ref 78.0–100.0)
Platelets: 797 10*3/uL — ABNORMAL HIGH (ref 150.0–400.0)
RBC: 3.41 Mil/uL — ABNORMAL LOW (ref 3.87–5.11)
RDW: 14.7 % (ref 11.5–15.5)
WBC: 10.2 10*3/uL (ref 4.0–10.5)

## 2013-09-18 LAB — TSH: TSH: 1.85 u[IU]/mL (ref 0.35–4.50)

## 2013-09-18 MED ORDER — FUROSEMIDE 20 MG PO TABS: 20.0000 mg | ORAL_TABLET | Freq: Every day | ORAL | Status: DC

## 2013-09-18 MED ORDER — AMIODARONE HCL 200 MG PO TABS: 200.0000 mg | ORAL_TABLET | Freq: Two times a day (BID) | ORAL | Status: DC

## 2013-09-18 MED ORDER — METOPROLOL TARTRATE 25 MG PO TABS: 12.5000 mg | ORAL_TABLET | Freq: Two times a day (BID) | ORAL | Status: DC

## 2013-09-18 NOTE — Patient Instructions (Addendum)
Cut the amiodarone back to 200 mg BID for one week and then cut to 200 mg just once a day  Cut the metoprolol back to 12.5 mg BID  Start Lasix 20 mg daily for one week only - then stop  Elevate legs as much as possible  Please wrap her lower legs in ACE wraps daily up to the knees - go back to support stockings when her legs stop weeping  I will see her in about 10 days.  We will check labs today  Call the [place] office at [phone] if you have any questions, problems or concerns.

## 2013-09-18 NOTE — Progress Notes (Signed)
[name] Date of Birth: [birth year] Medical Record #[id number]  History of Present Illness: Ms. Douglass is seen back today for a work in visit. Seen for Dr. Martinique. She is an 80 year old female - currently at [facility]. She has HTN, HLD, hyponatrremia, prior aspiration pneumonia who recently had left hip fracture. Had ORIF at the end of August but post op course complicated by ectopic atrial tachycardia with intermittent atrial flutter/fib with RVR - rates in the 150 to 170's associated with mild hypotension. Started on amiodarone. EF is 60 to 65%.  Call received yesterday from the facility - having weeping from her legs. No SOB, wheezing, crackles reported. Thus added to my schedule for today. Amiodarone being cut back as of the 11th. Transitioning to Eliquis as well.   Comes in today. Here with her husband. She is in a wheelchair. She feels puny. No appetite. Not really nauseated. No tremor. Breathing ok. Sits with her legs down for the most part. No support stockings. Legs are weeping - more on the left. Walking with the walker. No chest pain. Not dizzy.   Current Outpatient Prescriptions  Medication Sig Dispense Refill  . amiodarone (PACERONE) 400 MG tablet Take 400 mg by mouth 2 (two) times daily. Stop on 9/11, than amiodarone 400 mg po qd      . apixaban (ELIQUIS) 2.5 MG TABS tablet Take 1 tablet (2.5 mg total) by mouth 2 (two) times daily.  60 tablet    . aspirin 325 MG tablet Take 325 mg by mouth daily.      Marland Kitchen atorvastatin (LIPITOR) 10 MG tablet Take 10 mg by mouth daily at 6 PM.      . enoxaparin (LOVENOX) 40 MG/0.4ML injection Inject 40 mg into the skin daily. Stop 9/11      . HYDROcodone-acetaminophen (NORCO/VICODIN) 5-325 MG per tablet Take 1-2 tablets by mouth every 4 (four) hours as needed for moderate pain or severe pain.  80 tablet  0  . methocarbamol (ROBAXIN) 500 MG tablet Take 1 tablet (500 mg total) by mouth every 6 (six) hours as needed for muscle spasms.  80 tablet  0   . metoprolol tartrate (LOPRESSOR) 25 MG tablet Take 1 tablet (25 mg total) by mouth 2 (two) times daily.      . traMADol (ULTRAM) 50 MG tablet Take 1-2 tablets (50-100 mg total) by mouth every 6 (six) hours as needed (mild pain).  60 tablet  1   No current facility-administered medications for this visit.    No Known Allergies  Past Medical History  Diagnosis Date  . Cancer     skin cancer to left leg  . Hypertension     Past Surgical History  Procedure Laterality Date  . Cholecystectomy    . Abdominal hysterectomy    . Hip arthroplasty Left 09/05/2013    Procedure: ARTHROPLASTY BIPOLAR HIP;  Surgeon: Gearlean Alf, MD;  Location: [facility];  Service: Orthopedics;  Laterality: Left;    History  Smoking status  . Former Smoker  . Quit date: 09/05/1968  Smokeless tobacco  . Never Used    History  Alcohol Use  . 4.2 oz/week  . 7 Glasses of wine per week    Comment: 1  glass wine daily    Family History  Problem Relation Age of Onset  . CAD Father     died of MI at age 68    Review of Systems: The review of systems is per the  HPI.  All other systems were reviewed and are negative.  Physical Exam: BP 120/60  Pulse 55  Ht 5\' 2"  (1.575 m)  Wt 123 lb 6.4 oz (55.974 kg)  BMI 22.56 kg/m2  SpO2 90% Patient is very pleasant and in no acute distress. She looks frail. Color is pale. Skin is warm and dry.  HEENT is unremarkable. Normocephalic/atraumatic. PERRL. Sclera are nonicteric. Neck is supple. No masses. No JVD. Lungs are clear. Cardiac exam shows a regular rate and rhythm. Heart tones are distant. Abdomen is soft. Extremities are quite fleshy with 1+ edema. Gait not tested. No gross neurologic deficits noted.  Wt Readings from Last 3 Encounters:  09/18/13 123 lb 6.4 oz (55.974 kg)  09/10/13 131 lb 6.3 oz (59.6 kg)  09/10/13 131 lb 6.3 oz (59.6 kg)    LABORATORY DATA/PROCEDURES:  EKG today shows sinus brady - rate of 54.   Lab Results  Component Value Date    WBC 10.0 09/13/2013   HGB 8.1* 09/13/2013   HCT 24.7* 09/13/2013   PLT 419* 09/13/2013   GLUCOSE 115* 09/13/2013   NA 132* 09/13/2013   K 4.0 09/13/2013   CL 92* 09/13/2013   CREATININE 0.51 09/13/2013   BUN 9 09/13/2013   CO2 30 09/13/2013   TSH 2.400 09/09/2013   INR 1.03 09/08/2013   HGBA1C 6.4* 09/07/2013    BNP (last 3 results) No results found for this basename: PROBNP,  in the last 8760 hours  Echo Study Conclusions from August 2015  - Left ventricle: The cavity size was normal. Systolic function was normal. The estimated ejection fraction was in the range of 60% to 65%. Wall motion was normal; there were no regional wall motion abnormalities. Doppler parameters are consistent with abnormal left ventricular relaxation (grade 1 diastolic dysfunction). Doppler parameters are consistent with elevated ventricular end-diastolic filling pressure. - Aortic valve: Trileaflet; mildly thickened, mildly calcified leaflets. There was no regurgitation. - Aortic root: The aortic root was normal in size. - Mitral valve: Structurally normal valve. There was mild regurgitation. - Right ventricle: Systolic function was normal. - Right atrium: The atrium was normal in size. - Pulmonic valve: There was mild regurgitation. - Pulmonary arteries: Systolic pressure was within the normal range. - Inferior vena cava: The vessel was normal in size. - Pericardium, extracardiac: There was no pericardial effusion  Assessment / Plan: 1. PAF - holding in sinus on amiodarone - she is bradycardia - feeling pretty puny with no appetite - will cut back to 200 mg BID for one week and then to 200 mg a day. Cutting metoprolol back as well.   2. Prior hypotension - BP ok today. Previously on Norvasc and Losartan.  3. Post op anemia - on Eliquis and Lovenox - checking labs today.   4. Swelling - would wrap her legs in ACE wraps daily - go to support stockings when the weeping stops. Her weight is actually down. EF normal. Will  give her 20 mg of Lasix daily for just one week.   Check labs today. See back in about 10 days.   Patient is agreeable to this plan and will call if any problems develop in the interim.   Burtis Junes, RN, [place] [address] [place], [place]  [address] [phone]

## 2013-09-26 ENCOUNTER — Non-Acute Institutional Stay (SKILLED_NURSING_FACILITY): Payer: Medicare Other | Admitting: Adult Health

## 2013-09-26 ENCOUNTER — Encounter: Payer: Self-pay | Admitting: Adult Health

## 2013-09-26 DIAGNOSIS — S72009D Fracture of unspecified part of neck of unspecified femur, subsequent encounter for closed fracture with routine healing: Secondary | ICD-10-CM

## 2013-09-26 DIAGNOSIS — S72012D Unspecified intracapsular fracture of left femur, subsequent encounter for closed fracture with routine healing: Secondary | ICD-10-CM

## 2013-09-26 DIAGNOSIS — E785 Hyperlipidemia, unspecified: Secondary | ICD-10-CM

## 2013-09-26 DIAGNOSIS — D62 Acute posthemorrhagic anemia: Secondary | ICD-10-CM

## 2013-09-26 DIAGNOSIS — I4891 Unspecified atrial fibrillation: Secondary | ICD-10-CM

## 2013-09-26 DIAGNOSIS — I10 Essential (primary) hypertension: Secondary | ICD-10-CM

## 2013-09-26 NOTE — Progress Notes (Signed)
Patient ID: Alexa Middleton, female   DOB: 08/04/1927, 80 y.o.   MRN: [id number]              PROGRESS NOTE  DATE: 09/26/2013   FACILITY: [facility]  LEVEL OF CARE: SNF (31)  Acute Visit  CHIEF COMPLAINT:    Discharge Visit  HISTORY OF PRESENT ILLNESS:  This is an 80 year old female who is for discharge home with Home health PT, OT and Nursing. DME: Rolling walker and 3-in-1 commode.  She has been admitted to [facility] on 09/13/13 from [facility] with Subcapital fracture of left hip S/P left hip hemiarthroplasty. Patient was admitted to this facility for short-term rehabilitation after the patient's recent hospitalization.  Patient has completed SNF rehabilitation and therapy has cleared the patient for discharge.  Reassessment of ongoing problem(s):  HTN: Pt 's HTN remains stable.  Denies CP, sob, DOE, pedal edema, headaches, dizziness or visual disturbances.  No complications from the medications currently being used.  Last BP : 132/52  ANEMIA: The anemia has been stable. The patient denies fatigue, melena or hematochezia. No complications from the medications currently being used. 9/15  Hgb 10.7  ATRIAL FIBRILLATION: the patients atrial fibrillation remains stable.  The patient denies DOE, tachycardia, orthopnea, transient neurological sx, palpitations, & PNDs.  No complications noted from the medications currently being used.  PAST MEDICAL HISTORY : Reviewed.  No changes/see problem list  CURRENT MEDICATIONS: Reviewed per MAR/see medication list  REVIEW OF SYSTEMS:  GENERAL: no change in appetite, no fatigue, no weight changes, no fever, chills or weakness RESPIRATORY: no cough, SOB, DOE, wheezing, hemoptysis CARDIAC: no chest pain, edema or palpitations GI: no abdominal pain, diarrhea, constipation, heart burn, nausea or vomiting  PHYSICAL EXAMINATION  GENERAL: no acute distress, normal body habitus EYES: conjunctivae normal, sclerae normal,  normal eye lids NECK: supple, trachea midline, no neck masses, no thyroid tenderness, no thyromegaly LYMPHATICS: no LAN in the neck, no supraclavicular LAN RESPIRATORY: breathing is even & unlabored, BS CTAB CARDIAC: RRR, no murmur,no extra heart sounds, BLE edema 1+ GI: abdomen soft, normal BS, no masses, no tenderness, no hepatomegaly, no splenomegaly EXTREMITIES:  Able to move all 4 extremities PSYCHIATRIC: the patient is alert & oriented to person, affect & behavior appropriate  LABS/RADIOLOGY: 09/23/13  sodium 139 potassium 4.3 glucose 148 BUN 12 creatinine 0.7 calcium 8.6 hepatic panel normal except AST 47 ALT 60 WBC 7.1 hemoglobin 10.7 hematocrit 32.6 MCV 92.6 09/18/13  WBC 8.6 hemoglobin 10.3 hematocrit 31.7 MCV 91.1 Labs reviewed: Basic Metabolic Panel:  Recent Labs  09/07/13 0442 09/08/13 1454  09/12/13 0430 09/13/13 0536 09/18/13 1012  NA 137 128*  < > 134* 132* 121*  K 4.4 4.2  < > 4.0 4.0 4.5  CL 99 90*  < > 94* 92* 84*  CO2 28 26  < > 31 30 30   GLUCOSE 133* 172*  < > 117* 115* 127*  BUN 13 16  < > 10 9 9   CREATININE 0.50 0.56  < > 0.53 0.51 0.5  CALCIUM 8.5 7.9*  < > 7.8* 8.0* 8.0*  MG  --  2.0  --   --   --   --   PHOS  --  3.1  --   --   --   --   < > = values in this interval not displayed. Liver Function Tests:  Recent Labs  09/18/13 1012  AST 66*  ALT 64*  ALKPHOS 88  BILITOT 0.6  PROT 6.4  ALBUMIN 3.3*   CBC:  Recent Labs  09/05/13 1413  09/12/13 0430 09/13/13 0536 09/18/13 1012  WBC 10.1  < > 9.9 10.0 10.2  NEUTROABS 8.4*  --   --   --   --   HGB 13.3  < > 8.2* 8.1* 10.2*  HCT 39.5  < > 24.5* 24.7* 30.1*  MCV 86.8  < > 86.9 88.5 88.4  PLT 308  < > 352 419* 797.0*  < > = values in this interval not displayed.  Cardiac Enzymes:  Recent Labs  09/08/13 1454 09/08/13 2009 09/09/13 0232  TROPONINI <0.30 <0.30 <0.30   EXAM: LEFT KNEE - 1-2 VIEW   COMPARISON:  None.   FINDINGS: Soft tissue structures are unremarkable. Mild  tricompartment degenerative change. No evidence of fracture or dislocation .   IMPRESSION: Mild tricompartment degenerative change.  No acute abnormality. EXAM: LEFT ELBOW - COMPLETE 3+ VIEW   COMPARISON:  None.   FINDINGS: There is no evidence of fracture, dislocation, or joint effusion. There is no evidence of arthropathy or other focal bone abnormality. Soft tissues are unremarkable.   IMPRESSION: Negative exam. EXAM: LEFT HIP - COMPLETE 2+ VIEW   COMPARISON:  None.   FINDINGS: The patient has an acute subcapital fracture of the left hip. No other acute bony or joint abnormality is identified. Lower lumbar degenerative change is noted.   IMPRESSION: Acute subcapital fracture left hip. EXAM: PORTABLE PELVIS 1-2 VIEWS   COMPARISON:  Abdomen for 01/2005   FINDINGS: Interval left hip arthroplasty with cemented femoral component. Component appears well seated. Pelvis appears intact. No displaced fractures identified. Soft tissue gas consistent with recent surgery.   IMPRESSION: Left hip arthroplasty with component appearing well-seated. EXAM: PORTABLE CHEST - 1 VIEW   COMPARISON:  09/05/2013 and earlier.   FINDINGS: Portable AP semi upright view at 0944 hrs. Coarse bilateral pulmonary opacity. Stable lung volumes. Mildly increased left lung base opacity compared to prior Normal cardiac size and mediastinal contours. Visualized tracheal air column is within normal limits.   IMPRESSION: Chronic lung disease with superimposed patchy left lung base opacity suspicious for acute infectious exacerbation in this setting. EXAM: CT ANGIOGRAPHY CHEST WITH CONTRAST   TECHNIQUE: Multidetector CT imaging of the chest was performed using the standard protocol during bolus administration of intravenous contrast. Multiplanar CT image reconstructions and MIPs were obtained to evaluate the vascular anatomy.   CONTRAST:  121mL OMNIPAQUE IOHEXOL 350 MG/ML SOLN     COMPARISON:  Chest x-ray earlier today.   FINDINGS: No filling defects in the pulmonary arteries to suggest pulmonary emboli. Small bilateral pleural effusions. Heart is normal size. Aorta is normal caliber. Coronary artery and aortic calcifications noted. No mediastinal, hilar, or axillary adenopathy.   Moderate paraseptal emphysema. Peripheral subpleural reticulation and interstitial thickening compatible with fibrosis, most pronounced in the lung bases. Airspace opacity at the left base likely related to scarring. No other confluent airspace opacities.   Imaging into the upper abdomen shows no acute findings.   No acute bony abnormality.   Review of the MIP images confirms the above findings.   IMPRESSION: COPD, fibrosis.   Opacity at the left lung base likely related to scarring/ fibrosis.   Small bilateral effusions.   ASSESSMENT/PLAN:  Subtropical fracture of left hip status post left hip hemiarthroplasty - 4 home health PT, OT and nursing Hypertension - well controlled; continue metoprolol; recently discontinued losartan and amlodipine Hyperlipidemia - continue Lipitor Atrial fibrillation with  RVR - continue at a place, on amiodarone and metoprolol Anemia, acute blood loss - stable   I have filled out patient's discharge paperwork and written prescriptions.  Patient will receive home health PT, OT and Nursing.  DME provided:  Rolling walker and 3-in-1 commode  Total discharge time: Greater than 30 minutes  Discharge time involved coordination of the discharge process with Education officer, museum, nursing staff and therapy department. Medical justification for home health services/DME verified.  CPT CODE: 54656  Seth Bake - NP [facility] [phone]

## 2013-09-27 ENCOUNTER — Telehealth: Payer: Self-pay | Admitting: *Deleted

## 2013-09-27 ENCOUNTER — Ambulatory Visit (INDEPENDENT_AMBULATORY_CARE_PROVIDER_SITE_OTHER): Payer: Medicare Other | Admitting: Nurse Practitioner

## 2013-09-27 ENCOUNTER — Encounter: Payer: Self-pay | Admitting: Nurse Practitioner

## 2013-09-27 VITALS — BP 138/56 | HR 52 | Ht 62.0 in | Wt 115.4 lb

## 2013-09-27 DIAGNOSIS — Z7901 Long term (current) use of anticoagulants: Secondary | ICD-10-CM

## 2013-09-27 DIAGNOSIS — I480 Paroxysmal atrial fibrillation: Secondary | ICD-10-CM

## 2013-09-27 DIAGNOSIS — E871 Hypo-osmolality and hyponatremia: Secondary | ICD-10-CM

## 2013-09-27 DIAGNOSIS — I4891 Unspecified atrial fibrillation: Secondary | ICD-10-CM

## 2013-09-27 DIAGNOSIS — R609 Edema, unspecified: Secondary | ICD-10-CM

## 2013-09-27 MED ORDER — METOPROLOL TARTRATE 25 MG PO TABS: 12.5000 mg | ORAL_TABLET | Freq: Two times a day (BID) | ORAL | Status: AC

## 2013-09-27 MED ORDER — AMIODARONE HCL 200 MG PO TABS: 200.0000 mg | ORAL_TABLET | Freq: Every day | ORAL | Status: DC

## 2013-09-27 MED ORDER — FUROSEMIDE 20 MG PO TABS: 20.0000 mg | ORAL_TABLET | Freq: Every day | ORAL | Status: DC

## 2013-09-27 MED ORDER — APIXABAN 2.5 MG PO TABS: 2.5000 mg | ORAL_TABLET | Freq: Two times a day (BID) | ORAL | Status: AC

## 2013-09-27 NOTE — Progress Notes (Addendum)
Alexa Middleton Date of Birth: 1928-01-04 Medical Record #[id number]  History of Present Illness: Ms. Alexa Middleton is seen back today for a follow up visit. Seen for Dr. Martinique. She is an 80 year old female - currently at [facility]. She has HTN, HLD, hyponatremia, prior aspiration pneumonia who recently had left hip fracture. Had ORIF at the end of August but post op course complicated by ectopic atrial tachycardia with intermittent atrial flutter/fib with RVR - rates in the 150 to 170's associated with mild hypotension. Started on amiodarone. EF is 60 to 65%.   I saw her back on September 9th after receiving call regarding weeping from her legs. She looked pretty puny at my visit. Amiodarone was cut back. Metoprolol was cut back. Given small dose of Lasix for one week. Labs were checked - Sodium down to the 120's. Put on fluid restriction and made the house staff aware of her sodium level but I do not see where this was addressed at her visit.   Comes back today. Here with her husband. No medicine list sent with her. She is to be discharged to home on Saturday. She is doing much better. Not dizzy or lightheaded. No chest pain. Breathing ok. Walking with the walker.   Current Outpatient Prescriptions  Medication Sig Dispense Refill  . amiodarone (PACERONE) 200 MG tablet Take 1 tablet (200 mg total) by mouth 2 (two) times daily. 200 mg BID for one week and then just once a day  90 tablet  3  . apixaban (ELIQUIS) 2.5 MG TABS tablet Take 1 tablet (2.5 mg total) by mouth 2 (two) times daily.  60 tablet    . aspirin 325 MG tablet Take 325 mg by mouth daily.      Marland Kitchen atorvastatin (LIPITOR) 10 MG tablet Take 10 mg by mouth daily at 6 PM.      . HYDROcodone-acetaminophen (NORCO/VICODIN) 5-325 MG per tablet Take 1-2 tablets by mouth every 4 (four) hours as needed for moderate pain or severe pain.  80 tablet  0  . methocarbamol (ROBAXIN) 500 MG tablet Take 1 tablet (500 mg total) by mouth every 6 (six) hours as  needed for muscle spasms.  80 tablet  0  . metoprolol tartrate (LOPRESSOR) 25 MG tablet Take 0.5 tablets (12.5 mg total) by mouth 2 (two) times daily.      . traMADol (ULTRAM) 50 MG tablet Take 1-2 tablets (50-100 mg total) by mouth every 6 (six) hours as needed (mild pain).  60 tablet  1  . furosemide (LASIX) 20 MG tablet Take 1 tablet (20 mg total) by mouth daily.  30 tablet  3   No current facility-administered medications for this visit.    No Known Allergies  Past Medical History  Diagnosis Date  . Cancer     skin cancer to left leg  . Hypertension   . HLD (hyperlipidemia)   . Hyponatremia   . Atrial fibrillation     admitted back in August with AF with RVR - started on amiodarone  . Chronic anticoagulation     Past Surgical History  Procedure Laterality Date  . Cholecystectomy    . Abdominal hysterectomy    . Hip arthroplasty Left 09/05/2013    Procedure: ARTHROPLASTY BIPOLAR HIP;  Surgeon: Gearlean Alf, MD;  Location: [facility];  Service: Orthopedics;  Laterality: Left;    History  Smoking status  . Former Smoker  . Quit date: 09/05/1968  Smokeless tobacco  . Never Used  History  Alcohol Use  . 4.2 oz/week  . 7 Glasses of wine per week    Comment: 1  glass wine daily    Family History  Problem Relation Age of Onset  . CAD Father     died of MI at age 59    Review of Systems: The review of systems is per the HPI.  All other systems were reviewed and are negative.  Physical Exam: BP 138/56  Pulse 52  Ht 5\' 2"  (1.575 m)  Wt 115 lb 6.4 oz (52.345 kg)  BMI 21.10 kg/m2 Patient is very pleasant and in no acute distress. Skin is warm and dry. Color is normal.  HEENT is unremarkable. Normocephalic/atraumatic. PERRL. Sclera are nonicteric. Neck is supple. No masses. No JVD. Lungs are clear. Cardiac exam shows a regular rate and rhythm. Abdomen is soft. Extremities are without edema. Gait and ROM are intact. No gross neurologic deficits noted.  Wt Readings  from Last 3 Encounters:  09/27/13 115 lb 6.4 oz (52.345 kg)  09/26/13 115 lb (52.164 kg)  09/18/13 123 lb 6.4 oz (55.974 kg)    LABORATORY DATA/PROCEDURES:  Lab Results  Component Value Date   WBC 10.2 09/18/2013   HGB 10.2* 09/18/2013   HCT 30.1* 09/18/2013   PLT 797.0* 09/18/2013   GLUCOSE 127* 09/18/2013   ALT 64* 09/18/2013   AST 66* 09/18/2013   NA 121* 09/18/2013   K 4.5 09/18/2013   CL 84* 09/18/2013   CREATININE 0.5 09/18/2013   BUN 9 09/18/2013   CO2 30 09/18/2013   TSH 1.85 09/18/2013   INR 1.03 09/08/2013   HGBA1C 6.4* 09/07/2013    BNP (last 3 results) No results found for this basename: PROBNP,  in the last 8760 hours   Echo Study Conclusions from August 2015  - Left ventricle: The cavity size was normal. Systolic function was normal. The estimated ejection fraction was in the range of 60% to 65%. Wall motion was normal; there were no regional wall motion abnormalities. Doppler parameters are consistent with abnormal left ventricular relaxation (grade 1 diastolic dysfunction). Doppler parameters are consistent with elevated ventricular end-diastolic filling pressure. - Aortic valve: Trileaflet; mildly thickened, mildly calcified leaflets. There was no regurgitation. - Aortic root: The aortic root was normal in size. - Mitral valve: Structurally normal valve. There was mild regurgitation. - Right ventricle: Systolic function was normal. - Right atrium: The atrium was normal in size. - Pulmonic valve: There was mild regurgitation. - Pulmonary arteries: Systolic pressure was within the normal range. - Inferior vena cava: The vessel was normal in size. - Pericardium, extracardiac: There was no pericardial effusion    Assessment / Plan:  1. PAF - on amiodarone - check EKG today. She remains in sinus on exam. Amiodarone is 200 mg a day. Very low dose metoprolol as well.   2. Prior hypotension -  Previously on Norvasc and Losartan. Husband will monitor at home. May have to restart  in the future.   3. Post op anemia - stable. Recheck lab on return.   4. Swelling - improved.  Her weight continues to go down. EF normal. Will give her 20 mg of Lasix every other day for another week - then just back to prn which is what she used before.   5. Hyponatremia - last sodium back to 129. Continue fluid restriction.  6. Anticoagulation - samples given. Stopping aspirin on October 3rd.   See back in 3 weeks.   Patient is agreeable  to this plan and will call if any problems develop in the interim.   Burtis Junes, RN, ANP-C [facility] [address] [place], [place]  [address] [phone]    Addendum:  EKG today with sinus brady with a rate of 55. Asymptomatic. Will continue with once a day amiodarone and very low dose metoprolol for now.

## 2013-09-27 NOTE — Patient Instructions (Addendum)
I think you are doing much better  Follow this current list of medicines - I am giving you written prescriptions if needed once you get home  Use the lasix (furosemide) 20 mg just every other day for a week and then back to as needed  Limit your fluid intake to 1500 cc per day (this 1.5 liters)  See me or Dr. Martinique in 3 weeks with labs  Try to elevate your legs as much as you can.   Monitor your blood pressure at home - bring in your diary of readings - stay off the Norvasc and Losartan for now  Keep wearing your support stockings  Call the [place] office at [phone] if you have any questions, problems or concerns.

## 2013-09-27 NOTE — Telephone Encounter (Signed)
S/w Tirina, LPN, at [facility] @ [phone] was very pleasant and went over all current medications with me.  Pt's list updated per phone conversation.

## 2013-10-03 ENCOUNTER — Telehealth: Payer: Self-pay | Admitting: Cardiology

## 2013-10-03 NOTE — Telephone Encounter (Signed)
New message           Hilda Blades has some questions to ask about this pt / please give Hilda Blades a call back

## 2013-10-03 NOTE — Telephone Encounter (Signed)
Returned call to [place] with [facility] she stated she is enrolling pt in their cardio pulmonary program so they can watch her weight and B/P closely.Stated she will fax orders for Dr.Jordan to sign.

## 2013-10-09 ENCOUNTER — Ambulatory Visit: Payer: Medicare Other | Admitting: Physician Assistant

## 2013-10-15 ENCOUNTER — Encounter: Payer: Self-pay | Admitting: Nurse Practitioner

## 2013-10-15 ENCOUNTER — Ambulatory Visit (INDEPENDENT_AMBULATORY_CARE_PROVIDER_SITE_OTHER): Payer: Medicare Other | Admitting: Nurse Practitioner

## 2013-10-15 VITALS — BP 118/60 | HR 89 | Ht 62.0 in | Wt 113.8 lb

## 2013-10-15 DIAGNOSIS — Z7901 Long term (current) use of anticoagulants: Secondary | ICD-10-CM

## 2013-10-15 DIAGNOSIS — I480 Paroxysmal atrial fibrillation: Secondary | ICD-10-CM

## 2013-10-15 DIAGNOSIS — R609 Edema, unspecified: Secondary | ICD-10-CM

## 2013-10-15 DIAGNOSIS — I10 Essential (primary) hypertension: Secondary | ICD-10-CM

## 2013-10-15 LAB — CBC
HCT: 36.8 % (ref 36.0–46.0)
Hemoglobin: 11.9 g/dL — ABNORMAL LOW (ref 12.0–15.0)
MCHC: 32.3 g/dL (ref 30.0–36.0)
MCV: 89.3 fl (ref 78.0–100.0)
Platelets: 502 10*3/uL — ABNORMAL HIGH (ref 150.0–400.0)
RBC: 4.13 Mil/uL (ref 3.87–5.11)
RDW: 15.9 % — ABNORMAL HIGH (ref 11.5–15.5)
WBC: 10.6 10*3/uL — ABNORMAL HIGH (ref 4.0–10.5)

## 2013-10-15 LAB — HEPATIC FUNCTION PANEL
ALT: 100 U/L — ABNORMAL HIGH (ref 0–35)
AST: 64 U/L — ABNORMAL HIGH (ref 0–37)
Albumin: 3.5 g/dL (ref 3.5–5.2)
Alkaline Phosphatase: 246 U/L — ABNORMAL HIGH (ref 39–117)
Bilirubin, Direct: 0.1 mg/dL (ref 0.0–0.3)
Total Bilirubin: 0.4 mg/dL (ref 0.2–1.2)
Total Protein: 7.1 g/dL (ref 6.0–8.3)

## 2013-10-15 LAB — BASIC METABOLIC PANEL
BUN: 12 mg/dL (ref 6–23)
CO2: 35 mEq/L — ABNORMAL HIGH (ref 19–32)
Calcium: 8.5 mg/dL (ref 8.4–10.5)
Chloride: 93 mEq/L — ABNORMAL LOW (ref 96–112)
Creatinine, Ser: 0.7 mg/dL (ref 0.4–1.2)
GFR: 92.24 mL/min (ref >60.00)
Glucose, Bld: 142 mg/dL — ABNORMAL HIGH (ref 70–99)
Potassium: 4.3 mEq/L (ref 3.5–5.1)
Sodium: 133 mEq/L — ABNORMAL LOW (ref 135–145)

## 2013-10-15 MED ORDER — FUROSEMIDE 20 MG PO TABS: 20.0000 mg | ORAL_TABLET | Freq: Every day | ORAL | Status: AC | PRN

## 2013-10-15 NOTE — Patient Instructions (Addendum)
We will be checking the following labs today BMET, CBC, HPF  Stay on your current medicines but take the Lasix just "as needed"  We will see if we have samples of the Eliquis  See Dr. Martinique in December as planned  Keep a check on your blood pressure and record as you have been doing  Call the [place] office at [phone] if you have any questions, problems or concerns.

## 2013-10-15 NOTE — Progress Notes (Addendum)
Alexa Middleton Date of Birth: 12/14/1927 Medical Record #[id number]  History of Present Illness: Alexa Middleton is seen back today for a follow up visit. This is a 3 week check. Seen for Dr. Martinique. She is an 80 year old female - currently at [facility]. She has HTN, HLD, hyponatremia, prior aspiration pneumonia who recently had left hip fracture. Had ORIF at the end of August but post op course complicated by ectopic atrial tachycardia with intermittent atrial flutter/fib with RVR - rates in the 150 to 170's associated with mild hypotension. Started on amiodarone. EF is 60 to 65%.   I saw her back on September 9th after receiving call regarding weeping from her legs. She looked pretty puny at my visit. Amiodarone was cut back. Metoprolol was cut back. Given small dose of Lasix for one week. Labs were checked - Sodium down to the 120's. Put on fluid restriction and made the house staff aware of her sodium level but I could not see where this was addressed at her visit.   I saw her back 3 weeks ago - she was doing much better. Getting ready to be discharged home. She remained in sinus rhythm.   Comes back today. Here with her husband. She is home now. Off her pain medicines. Off aspirin. She is walking with her walker. Doing better.  Not short of breath. Slowly getting stronger. Appetite improving. Swelling has improved. Continues to take her Lasix every day. BP at home is great. She remains off of her Norvasc and Losartan.  Current Outpatient Prescriptions  Medication Sig Dispense Refill  . amiodarone (PACERONE) 200 MG tablet Take 1 tablet (200 mg total) by mouth daily.  90 tablet  3  . apixaban (ELIQUIS) 2.5 MG TABS tablet Take 1 tablet (2.5 mg total) by mouth 2 (two) times daily.  180 tablet  3  . atorvastatin (LIPITOR) 10 MG tablet Take 10 mg by mouth daily at 6 PM.      . furosemide (LASIX) 20 MG tablet Take 1 tablet (20 mg total) by mouth daily.  90 tablet  3  . metoprolol tartrate (LOPRESSOR)  25 MG tablet Take 0.5 tablets (12.5 mg total) by mouth 2 (two) times daily.  90 tablet  3   No current facility-administered medications for this visit.    No Known Allergies  Past Medical History  Diagnosis Date  . Cancer     skin cancer to left leg  . Hypertension   . HLD (hyperlipidemia)   . Hyponatremia   . Atrial fibrillation     admitted back in August with AF with RVR - started on amiodarone  . Chronic anticoagulation     Past Surgical History  Procedure Laterality Date  . Cholecystectomy    . Abdominal hysterectomy    . Hip arthroplasty Left 09/05/2013    Procedure: ARTHROPLASTY BIPOLAR HIP;  Surgeon: Gearlean Alf, MD;  Location: [facility];  Service: Orthopedics;  Laterality: Left;    History  Smoking status  . Former Smoker  . Quit date: 09/05/1968  Smokeless tobacco  . Never Used    History  Alcohol Use  . 4.2 oz/week  . 7 Glasses of wine per week    Comment: 1  glass wine daily    Family History  Problem Relation Age of Onset  . CAD Father     died of MI at age 50    Review of Systems: The review of systems is per the HPI.  All other systems were reviewed and are negative.  Physical Exam: BP 118/60  Pulse 89  Ht 5\' 2"  (1.575 m)  Wt 113 lb 12.8 oz (51.619 kg)  BMI 20.81 kg/m2  SpO2 89% Patient is very pleasant and in no acute distress. She looks much stronger. Oxygen sat by me is 90%. Skin is warm and dry. Color is normal.  HEENT is unremarkable. Normocephalic/atraumatic. PERRL. Sclera are nonicteric. Neck is supple. No masses. No JVD. Lungs are clear. Cardiac exam shows a regular rate and rhythm. Abdomen is soft. Extremities are with 1+ edema today. She has support stockings in place. Gait and ROM are intact. Using a walker. No gross neurologic deficits noted.  Wt Readings from Last 3 Encounters:  10/15/13 113 lb 12.8 oz (51.619 kg)  09/27/13 115 lb 6.4 oz (52.345 kg)  09/26/13 115 lb (52.164 kg)    LABORATORY DATA/PROCEDURES:  Lab Results   Component Value Date   WBC 10.2 09/18/2013   HGB 10.2* 09/18/2013   HCT 30.1* 09/18/2013   PLT 797.0* 09/18/2013   GLUCOSE 127* 09/18/2013   ALT 64* 09/18/2013   AST 66* 09/18/2013   NA 121* 09/18/2013   K 4.5 09/18/2013   CL 84* 09/18/2013   CREATININE 0.5 09/18/2013   BUN 9 09/18/2013   CO2 30 09/18/2013   TSH 1.85 09/18/2013   INR 1.03 09/08/2013   HGBA1C 6.4* 09/07/2013    BNP (last 3 results) No results found for this basename: PROBNP,  in the last 8760 hours  Echo Study Conclusions from August 2015  - Left ventricle: The cavity size was normal. Systolic function was normal. The estimated ejection fraction was in the range of 60% to 65%. Wall motion was normal; there were no regional wall motion abnormalities. Doppler parameters are consistent with abnormal left ventricular relaxation (grade 1 diastolic dysfunction). Doppler parameters are consistent with elevated ventricular end-diastolic filling pressure. - Aortic valve: Trileaflet; mildly thickened, mildly calcified leaflets. There was no regurgitation. - Aortic root: The aortic root was normal in size. - Mitral valve: Structurally normal valve. There was mild regurgitation. - Right ventricle: Systolic function was normal. - Right atrium: The atrium was normal in size. - Pulmonic valve: There was mild regurgitation. - Pulmonary arteries: Systolic pressure was within the normal range. - Inferior vena cava: The vessel was normal in size. - Pericardium, extracardiac: There was no pericardial effusion     Assessment / Plan:  1. PAF - on amiodarone - holding in sinus by exam today. Recheck surveillance labs today.   2. Prior hypotension - Previously on Norvasc and Losartan. She continues to have satisfactory readings here and at home. No need to restart the Norvasc or Losartan at this time. Husband will continue to monitor.   3. Post op anemia - stable. Rechecking lab today.   4. Swelling - improved. Her weight continues to go down. EF  normal. Will change lasix to just prn which is what she used before.   5. Hyponatremia - rechecking labs today  6. Anticoagulation - off of her aspirin. On Eliquis - no problems noted. Samples given.  Seeing Dr. Martinique in December.   Patient is agreeable to this plan and will call if any problems develop in the interim.   Burtis Junes, RN, [facility] [address] [place], [place]  [phone]

## 2013-10-16 ENCOUNTER — Other Ambulatory Visit: Payer: Self-pay | Admitting: *Deleted

## 2013-10-16 DIAGNOSIS — I480 Paroxysmal atrial fibrillation: Secondary | ICD-10-CM

## 2013-10-16 DIAGNOSIS — I10 Essential (primary) hypertension: Secondary | ICD-10-CM

## 2013-10-16 DIAGNOSIS — Z7901 Long term (current) use of anticoagulants: Secondary | ICD-10-CM

## 2013-10-16 MED ORDER — AMIODARONE HCL 100 MG PO TABS: 100.0000 mg | ORAL_TABLET | Freq: Every day | ORAL | Status: DC

## 2013-11-05 ENCOUNTER — Other Ambulatory Visit: Payer: Self-pay | Admitting: Dermatology

## 2013-11-13 ENCOUNTER — Telehealth: Payer: Self-pay | Admitting: Nurse Practitioner

## 2013-11-13 NOTE — Telephone Encounter (Signed)
New message      Question about amiodarone

## 2013-11-13 NOTE — Telephone Encounter (Signed)
S/w pt for clarification of amiodarone pt is suppose to be taking ( 100 mg ) daily as given report with labs.  Will come in on November 6 for lab work.  Pt stated verbal understanding.

## 2013-11-15 ENCOUNTER — Other Ambulatory Visit (INDEPENDENT_AMBULATORY_CARE_PROVIDER_SITE_OTHER): Payer: Medicare Other | Admitting: *Deleted

## 2013-11-15 DIAGNOSIS — Z7901 Long term (current) use of anticoagulants: Secondary | ICD-10-CM

## 2013-11-15 DIAGNOSIS — I10 Essential (primary) hypertension: Secondary | ICD-10-CM

## 2013-11-15 DIAGNOSIS — I480 Paroxysmal atrial fibrillation: Secondary | ICD-10-CM

## 2013-11-15 LAB — CBC WITH DIFFERENTIAL/PLATELET
Basophils Absolute: 0 10*3/uL (ref 0.0–0.1)
Basophils Relative: 0.4 % (ref 0.0–3.0)
Eosinophils Absolute: 0.2 10*3/uL (ref 0.0–0.7)
Eosinophils Relative: 2.8 % (ref 0.0–5.0)
HCT: 36.2 % (ref 36.0–46.0)
Hemoglobin: 11.7 g/dL — ABNORMAL LOW (ref 12.0–15.0)
Lymphocytes Relative: 21.6 % (ref 12.0–46.0)
Lymphs Abs: 1.6 10*3/uL (ref 0.7–4.0)
MCHC: 32.2 g/dL (ref 30.0–36.0)
MCV: 89 fl (ref 78.0–100.0)
Monocytes Absolute: 0.6 10*3/uL (ref 0.1–1.0)
Monocytes Relative: 8.3 % (ref 3.0–12.0)
Neutro Abs: 5 10*3/uL (ref 1.4–7.7)
Neutrophils Relative %: 66.9 % (ref 43.0–77.0)
Platelets: 372 10*3/uL (ref 150.0–400.0)
RBC: 4.07 Mil/uL (ref 3.87–5.11)
RDW: 15.8 % — ABNORMAL HIGH (ref 11.5–15.5)
WBC: 7.4 10*3/uL (ref 4.0–10.5)

## 2013-11-15 LAB — BASIC METABOLIC PANEL
BUN: 16 mg/dL (ref 6–23)
CO2: 35 mEq/L — ABNORMAL HIGH (ref 19–32)
Calcium: 8.6 mg/dL (ref 8.4–10.5)
Chloride: 96 mEq/L (ref 96–112)
Creatinine, Ser: 0.7 mg/dL (ref 0.4–1.2)
GFR: 86.08 mL/min (ref >60.00)
Glucose, Bld: 90 mg/dL (ref 70–99)
Potassium: 4.4 mEq/L (ref 3.5–5.1)
Sodium: 136 mEq/L (ref 135–145)

## 2013-11-15 LAB — HEPATIC FUNCTION PANEL
ALT: 36 U/L — ABNORMAL HIGH (ref 0–35)
AST: 38 U/L — ABNORMAL HIGH (ref 0–37)
Albumin: 3 g/dL — ABNORMAL LOW (ref 3.5–5.2)
Alkaline Phosphatase: 161 U/L — ABNORMAL HIGH (ref 39–117)
Bilirubin, Direct: 0.1 mg/dL (ref 0.0–0.3)
Total Bilirubin: 0.3 mg/dL (ref 0.2–1.2)
Total Protein: 6.7 g/dL (ref 6.0–8.3)

## 2014-01-08 ENCOUNTER — Ambulatory Visit (INDEPENDENT_AMBULATORY_CARE_PROVIDER_SITE_OTHER): Payer: Medicare Other | Admitting: Cardiology

## 2014-01-08 ENCOUNTER — Encounter: Payer: Self-pay | Admitting: Cardiology

## 2014-01-08 VITALS — BP 183/66 | HR 57 | Ht 62.0 in | Wt 109.0 lb

## 2014-01-08 DIAGNOSIS — I10 Essential (primary) hypertension: Secondary | ICD-10-CM

## 2014-01-08 DIAGNOSIS — I4891 Unspecified atrial fibrillation: Secondary | ICD-10-CM

## 2014-01-08 NOTE — Progress Notes (Signed)
Alexa Middleton Date of Birth: Jun 06, 1927 Medical Record #[id number]  History of Present Illness: Alexa Middleton is seen back today for follow up of atrial arhythmia.  She has HTN, HLD, hyponatremia, prior aspiration pneumonia who is s/p left hip fracture. Had ORIF at the end of August but post op course complicated by ectopic atrial tachycardia with intermittent atrial flutter/fib with RVR. Started on amiodarone. Normal EF. She was DC to a Rehab facility but is now back home.  Today she feels well. She denies any palpitations, SOB, or chest pain. BP at home is checked regularly and runs 120-130. She uses lasix sparingly but still has some ankle edema. Overall she feels she is getting back to baseline.   Current Outpatient Prescriptions  Medication Sig Dispense Refill  . apixaban (ELIQUIS) 2.5 MG TABS tablet Take 1 tablet (2.5 mg total) by mouth 2 (two) times daily. 180 tablet 3  . atorvastatin (LIPITOR) 10 MG tablet Take 10 mg by mouth daily at 6 PM.    . Cholecalciferol (VITAMIN D PO) Take by mouth.    . furosemide (LASIX) 20 MG tablet Take 1 tablet (20 mg total) by mouth daily as needed (prn swelling/weight gain). 90 tablet 3  . metoprolol tartrate (LOPRESSOR) 25 MG tablet Take 0.5 tablets (12.5 mg total) by mouth 2 (two) times daily. 90 tablet 3   No current facility-administered medications for this visit.    No Known Allergies  Past Medical History  Diagnosis Date  . Cancer     skin cancer to left leg  . Hypertension   . HLD (hyperlipidemia)   . Hyponatremia   . Atrial fibrillation     admitted back in August with AF with RVR - started on amiodarone  . Chronic anticoagulation     Past Surgical History  Procedure Laterality Date  . Cholecystectomy    . Abdominal hysterectomy    . Hip arthroplasty Left 09/05/2013    Procedure: ARTHROPLASTY BIPOLAR HIP;  Surgeon: Gearlean Alf, MD;  Location: [facility];  Service: Orthopedics;  Laterality: Left;    History  Smoking status  .  Former Smoker  . Quit date: 09/05/1968  Smokeless tobacco  . Never Used    History  Alcohol Use  . 4.2 oz/week  . 7 Glasses of wine per week    Comment: 1  glass wine daily    Family History  Problem Relation Age of Onset  . CAD Father     died of MI at age 4    Review of Systems: The review of systems is per the HPI.  All other systems were reviewed and are negative.  Physical Exam: BP 183/66 mmHg  Pulse 57  Ht 5\' 2"  (1.575 m)  Wt 109 lb (49.442 kg)  BMI 19.93 kg/m2 Patient is a pleasant WF in no acute distress.  Skin is warm and dry. Color is normal.  HEENT is unremarkable. Normocephalic/atraumatic. PERRL. Sclera are nonicteric. Neck is supple. No masses. No JVD. Lungs are clear. Cardiac exam shows a regular rate and rhythm. Normal S1-2. No gallop or murmur. Abdomen is soft. Extremities are with 1+ ankle edema today.  Gait and ROM are intact.  No gross neurologic deficits noted.  Wt Readings from Last 3 Encounters:  01/08/14 109 lb (49.442 kg)  10/15/13 113 lb 12.8 oz (51.619 kg)  09/27/13 115 lb 6.4 oz (52.345 kg)    LABORATORY DATA/PROCEDURES:  Lab Results  Component Value Date   WBC 7.4 11/15/2013  HGB 11.7* 11/15/2013   HCT 36.2 11/15/2013   PLT 372.0 11/15/2013   GLUCOSE 90 11/15/2013   ALT 36* 11/15/2013   AST 38* 11/15/2013   NA 136 11/15/2013   K 4.4 11/15/2013   CL 96 11/15/2013   CREATININE 0.7 11/15/2013   BUN 16 11/15/2013   CO2 35* 11/15/2013   TSH 1.85 09/18/2013   INR 1.03 09/08/2013   HGBA1C 6.4* 09/07/2013    BNP (last 3 results) No results for input(s): PROBNP in the last 8760 hours.  Echo Study Conclusions from August 2015  - Left ventricle: The cavity size was normal. Systolic function was normal. The estimated ejection fraction was in the range of 60% to 65%. Wall motion was normal; there were no regional wall motion abnormalities. Doppler parameters are consistent with abnormal left ventricular relaxation (grade 1  diastolic dysfunction). Doppler parameters are consistent with elevated ventricular end-diastolic filling pressure. - Aortic valve: Trileaflet; mildly thickened, mildly calcified leaflets. There was no regurgitation. - Aortic root: The aortic root was normal in size. - Mitral valve: Structurally normal valve. There was mild regurgitation. - Right ventricle: Systolic function was normal. - Right atrium: The atrium was normal in size. - Pulmonic valve: There was mild regurgitation. - Pulmonary arteries: Systolic pressure was within the normal range. - Inferior vena cava: The vessel was normal in size. - Pericardium, extracardiac: There was no pericardial effusion     Assessment / Plan:  1. PAF/ectopic atrial tachycardia - maintaining sinus by exam today. Recommend stopping amiodarone at this point. Continue metoprolol. Her arrhythmia was related to multiple stressors in the hospital. Will follow up in 6 months. If she remains in NSR would consider stopping Eliquis and switch to ASA 81 mg daily.  2. HTN - Previously on Norvasc and Losartan. She continues to have satisfactory readings  at home. Continue metoprolol and monitor.  3.  Swelling - improved. EF normal. Will continue lasix prn. Recommend conservative therapy with support hose.  4. Hyponatremia

## 2014-01-08 NOTE — Patient Instructions (Signed)
Stop taking amiodarone  Continue your other therapy  Wear support hose.  I will see you in 6 months.

## 2014-05-06 ENCOUNTER — Other Ambulatory Visit: Payer: Self-pay | Admitting: Dermatology

## 2014-06-17 ENCOUNTER — Other Ambulatory Visit: Payer: Self-pay | Admitting: Endocrinology

## 2014-06-17 DIAGNOSIS — R634 Abnormal weight loss: Secondary | ICD-10-CM

## 2014-06-24 ENCOUNTER — Other Ambulatory Visit: Payer: Self-pay | Admitting: Endocrinology

## 2014-06-24 ENCOUNTER — Ambulatory Visit
Admission: RE | Admit: 2014-06-24 | Discharge: 2014-06-24 | Disposition: A | Discharge status: Home / Self Care | Payer: Medicare Other | Source: Ambulatory Visit | Attending: Endocrinology | Admitting: Endocrinology

## 2014-06-24 DIAGNOSIS — R634 Abnormal weight loss: Secondary | ICD-10-CM

## 2014-06-27 ENCOUNTER — Other Ambulatory Visit: Payer: Self-pay | Admitting: Endocrinology

## 2014-06-27 DIAGNOSIS — R634 Abnormal weight loss: Secondary | ICD-10-CM

## 2014-07-02 ENCOUNTER — Ambulatory Visit
Admission: RE | Admit: 2014-07-02 | Discharge: 2014-07-02 | Disposition: A | Discharge status: Home / Self Care | Payer: Medicare Other | Source: Ambulatory Visit | Attending: Endocrinology | Admitting: Endocrinology

## 2014-07-02 DIAGNOSIS — R634 Abnormal weight loss: Secondary | ICD-10-CM

## 2014-07-02 MED ORDER — IOPAMIDOL (ISOVUE-300) INJECTION 61%
100.0000 mL | Freq: Once | INTRAVENOUS | Status: AC | PRN
  Administered 2014-07-02: 100 mL via INTRAVENOUS

## 2014-07-07 ENCOUNTER — Telehealth (HOSPITAL_COMMUNITY): Payer: Self-pay

## 2014-07-07 ENCOUNTER — Other Ambulatory Visit (HOSPITAL_COMMUNITY): Payer: Self-pay | Admitting: Endocrinology

## 2014-07-07 DIAGNOSIS — R160 Hepatomegaly, not elsewhere classified: Secondary | ICD-10-CM

## 2014-07-08 NOTE — Telephone Encounter (Signed)
error 

## 2014-07-11 ENCOUNTER — Inpatient Hospital Stay (HOSPITAL_COMMUNITY)
Admission: EM | Admit: 2014-07-11 | Discharge: 2014-08-11 | DRG: 291 | Disposition: E | Payer: Medicare Other | Attending: Cardiovascular Disease | Admitting: Cardiovascular Disease

## 2014-07-11 ENCOUNTER — Ambulatory Visit (HOSPITAL_COMMUNITY)
Admit: 2014-07-11 | Discharge: 2014-07-11 | Disposition: A | Discharge status: Home / Self Care | Payer: Medicare Other | Attending: Physician Assistant | Admitting: Physician Assistant

## 2014-07-11 ENCOUNTER — Encounter (HOSPITAL_COMMUNITY): Payer: Self-pay | Admitting: Emergency Medicine

## 2014-07-11 ENCOUNTER — Other Ambulatory Visit: Payer: Self-pay

## 2014-07-11 ENCOUNTER — Emergency Department (HOSPITAL_COMMUNITY): Payer: Medicare Other

## 2014-07-11 ENCOUNTER — Other Ambulatory Visit: Payer: Self-pay | Admitting: Physician Assistant

## 2014-07-11 DIAGNOSIS — C787 Secondary malignant neoplasm of liver and intrahepatic bile duct: Secondary | ICD-10-CM | POA: Diagnosis present

## 2014-07-11 DIAGNOSIS — R4182 Altered mental status, unspecified: Secondary | ICD-10-CM

## 2014-07-11 DIAGNOSIS — I480 Paroxysmal atrial fibrillation: Secondary | ICD-10-CM | POA: Diagnosis present

## 2014-07-11 DIAGNOSIS — I481 Persistent atrial fibrillation: Secondary | ICD-10-CM | POA: Diagnosis present

## 2014-07-11 DIAGNOSIS — Z515 Encounter for palliative care: Secondary | ICD-10-CM | POA: Diagnosis not present

## 2014-07-11 DIAGNOSIS — Z66 Do not resuscitate: Secondary | ICD-10-CM | POA: Diagnosis present

## 2014-07-11 DIAGNOSIS — E871 Hypo-osmolality and hyponatremia: Secondary | ICD-10-CM | POA: Diagnosis present

## 2014-07-11 DIAGNOSIS — Z96642 Presence of left artificial hip joint: Secondary | ICD-10-CM | POA: Diagnosis present

## 2014-07-11 DIAGNOSIS — Z7901 Long term (current) use of anticoagulants: Secondary | ICD-10-CM | POA: Diagnosis not present

## 2014-07-11 DIAGNOSIS — E873 Alkalosis: Secondary | ICD-10-CM | POA: Diagnosis present

## 2014-07-11 DIAGNOSIS — Z87891 Personal history of nicotine dependence: Secondary | ICD-10-CM

## 2014-07-11 DIAGNOSIS — J9622 Acute and chronic respiratory failure with hypercapnia: Secondary | ICD-10-CM | POA: Diagnosis present

## 2014-07-11 DIAGNOSIS — R160 Hepatomegaly, not elsewhere classified: Secondary | ICD-10-CM | POA: Diagnosis not present

## 2014-07-11 DIAGNOSIS — I5033 Acute on chronic diastolic (congestive) heart failure: Secondary | ICD-10-CM | POA: Diagnosis present

## 2014-07-11 DIAGNOSIS — I4892 Unspecified atrial flutter: Secondary | ICD-10-CM | POA: Diagnosis present

## 2014-07-11 DIAGNOSIS — E785 Hyperlipidemia, unspecified: Secondary | ICD-10-CM | POA: Diagnosis present

## 2014-07-11 DIAGNOSIS — R7989 Other specified abnormal findings of blood chemistry: Secondary | ICD-10-CM | POA: Diagnosis not present

## 2014-07-11 DIAGNOSIS — R609 Edema, unspecified: Secondary | ICD-10-CM | POA: Diagnosis not present

## 2014-07-11 DIAGNOSIS — R627 Adult failure to thrive: Secondary | ICD-10-CM | POA: Diagnosis present

## 2014-07-11 DIAGNOSIS — I509 Heart failure, unspecified: Secondary | ICD-10-CM | POA: Diagnosis not present

## 2014-07-11 DIAGNOSIS — Z681 Body mass index (BMI) 19 or less, adult: Secondary | ICD-10-CM | POA: Diagnosis not present

## 2014-07-11 DIAGNOSIS — R002 Palpitations: Secondary | ICD-10-CM

## 2014-07-11 DIAGNOSIS — I5031 Acute diastolic (congestive) heart failure: Secondary | ICD-10-CM | POA: Diagnosis present

## 2014-07-11 DIAGNOSIS — R0602 Shortness of breath: Secondary | ICD-10-CM | POA: Diagnosis present

## 2014-07-11 DIAGNOSIS — E876 Hypokalemia: Secondary | ICD-10-CM | POA: Diagnosis not present

## 2014-07-11 DIAGNOSIS — E875 Hyperkalemia: Secondary | ICD-10-CM | POA: Diagnosis not present

## 2014-07-11 DIAGNOSIS — I4891 Unspecified atrial fibrillation: Secondary | ICD-10-CM | POA: Diagnosis not present

## 2014-07-11 DIAGNOSIS — E43 Unspecified severe protein-calorie malnutrition: Secondary | ICD-10-CM | POA: Diagnosis present

## 2014-07-11 DIAGNOSIS — R791 Abnormal coagulation profile: Secondary | ICD-10-CM

## 2014-07-11 DIAGNOSIS — C801 Malignant (primary) neoplasm, unspecified: Secondary | ICD-10-CM | POA: Diagnosis present

## 2014-07-11 DIAGNOSIS — R778 Other specified abnormalities of plasma proteins: Secondary | ICD-10-CM

## 2014-07-11 DIAGNOSIS — I10 Essential (primary) hypertension: Secondary | ICD-10-CM | POA: Diagnosis present

## 2014-07-11 HISTORY — DX: Heart failure, unspecified: I50.9

## 2014-07-11 LAB — CBC WITH DIFFERENTIAL/PLATELET
BASOS ABS: 0 10*3/uL (ref 0.0–0.1)
Basophils Relative: 0 % (ref 0–1)
EOS ABS: 0 10*3/uL (ref 0.0–0.7)
EOS PCT: 0 % (ref 0–5)
HCT: 39.2 % (ref 36.0–46.0)
HEMOGLOBIN: 13.1 g/dL (ref 12.0–15.0)
Lymphocytes Relative: 4 % — ABNORMAL LOW (ref 12–46)
Lymphs Abs: 0.3 10*3/uL — ABNORMAL LOW (ref 0.7–4.0)
MCH: 29.4 pg (ref 26.0–34.0)
MCHC: 33.4 g/dL (ref 30.0–36.0)
MCV: 87.9 fL (ref 78.0–100.0)
Monocytes Absolute: 0.6 10*3/uL (ref 0.1–1.0)
Monocytes Relative: 7 % (ref 3–12)
NEUTROS ABS: 6.9 10*3/uL (ref 1.7–7.7)
NEUTROS PCT: 89 % — AB (ref 43–77)
Platelets: 384 10*3/uL (ref 150–400)
RBC: 4.46 MIL/uL (ref 3.87–5.11)
RDW: 14.3 % (ref 11.5–15.5)
WBC: 7.8 10*3/uL (ref 4.0–10.5)

## 2014-07-11 LAB — TSH: TSH: 1.583 u[IU]/mL (ref 0.350–4.500)

## 2014-07-11 LAB — I-STAT ARTERIAL BLOOD GAS, ED
ACID-BASE EXCESS: 8 mmol/L — AB (ref 0.0–2.0)
Bicarbonate: 35.8 mEq/L — ABNORMAL HIGH (ref 20.0–24.0)
O2 SAT: 98 %
PO2 ART: 119 mmHg — AB (ref 80.0–100.0)
Patient temperature: 98.6
TCO2: 38 mmol/L (ref 0–100)
pCO2 arterial: 62.6 mmHg (ref 35.0–45.0)
pH, Arterial: 7.365 (ref 7.350–7.450)

## 2014-07-11 LAB — COMPREHENSIVE METABOLIC PANEL
ALT: 21 U/L (ref 14–54)
AST: 37 U/L (ref 15–41)
Albumin: 3.1 g/dL — ABNORMAL LOW (ref 3.5–5.0)
Alkaline Phosphatase: 178 U/L — ABNORMAL HIGH (ref 38–126)
Anion gap: 12 (ref 5–15)
BUN: 16 mg/dL (ref 6–20)
CALCIUM: 8.2 mg/dL — AB (ref 8.9–10.3)
CHLORIDE: 86 mmol/L — AB (ref 101–111)
CO2: 29 mmol/L (ref 22–32)
Creatinine, Ser: 0.79 mg/dL (ref 0.44–1.00)
GFR calc Af Amer: >60 mL/min (ref >60)
GFR calc non Af Amer: >60 mL/min (ref >60)
GLUCOSE: 149 mg/dL — AB (ref 65–99)
POTASSIUM: 5.3 mmol/L — AB (ref 3.5–5.1)
Sodium: 127 mmol/L — ABNORMAL LOW (ref 135–145)
Total Bilirubin: 0.9 mg/dL (ref 0.3–1.2)
Total Protein: 5.6 g/dL — ABNORMAL LOW (ref 6.5–8.1)

## 2014-07-11 LAB — TROPONIN I
Troponin I: 0.09 ng/mL — ABNORMAL HIGH (ref <0.031)
Troponin I: 0.09 ng/mL — ABNORMAL HIGH (ref <0.031)
Troponin I: 0.1 ng/mL — ABNORMAL HIGH (ref <0.031)

## 2014-07-11 LAB — D-DIMER, QUANTITATIVE (NOT AT ARMC): D-Dimer, Quant: 1.46 ug/mL-FEU — ABNORMAL HIGH (ref 0.00–0.48)

## 2014-07-11 MED ORDER — ATORVASTATIN CALCIUM 10 MG PO TABS
10.0000 mg | ORAL_TABLET | Freq: Every day | ORAL | Status: DC
  Administered 2014-07-11 – 2014-07-17 (×7): 10 mg via ORAL
  Filled 2014-07-11 (×9): qty 1

## 2014-07-11 MED ORDER — FUROSEMIDE 10 MG/ML IJ SOLN
40.0000 mg | Freq: Two times a day (BID) | INTRAMUSCULAR | Status: DC
Start: 2014-07-11 — End: 2014-07-13
  Administered 2014-07-11 – 2014-07-13 (×5): 40 mg via INTRAVENOUS
  Filled 2014-07-11 (×7): qty 4

## 2014-07-11 MED ORDER — ACETAMINOPHEN 325 MG PO TABS: 650.0000 mg | ORAL_TABLET | ORAL | Status: DC | PRN

## 2014-07-11 MED ORDER — NITROGLYCERIN 0.4 MG SL SUBL: 0.4000 mg | SUBLINGUAL_TABLET | SUBLINGUAL | Status: DC | PRN

## 2014-07-11 MED ORDER — ZOLPIDEM TARTRATE 5 MG PO TABS: 5.0000 mg | ORAL_TABLET | Freq: Every evening | ORAL | Status: DC | PRN

## 2014-07-11 MED ORDER — DILTIAZEM HCL 100 MG IV SOLR
5.0000 mg/h | INTRAVENOUS | Status: DC
  Administered 2014-07-11: 10 mg/h via INTRAVENOUS
  Administered 2014-07-12: 5 mg/h via INTRAVENOUS
  Filled 2014-07-11 (×2): qty 100

## 2014-07-11 MED ORDER — ALPRAZOLAM 0.25 MG PO TABS
0.2500 mg | ORAL_TABLET | Freq: Two times a day (BID) | ORAL | Status: DC | PRN
  Administered 2014-07-17: 0.25 mg via ORAL
  Filled 2014-07-11: qty 1

## 2014-07-11 MED ORDER — SODIUM CHLORIDE 0.9 % IV SOLN: INTRAVENOUS | Status: DC

## 2014-07-11 MED ORDER — WHITE PETROLATUM GEL
1.0000 "application " | Freq: Every day | Status: DC
  Administered 2014-07-11: 0.2 via TOPICAL
  Administered 2014-07-12 – 2014-07-18 (×7): 1 via TOPICAL
  Filled 2014-07-11: qty 28.35
  Filled 2014-07-11: qty 1
  Filled 2014-07-11 (×9): qty 28.35
  Filled 2014-07-11: qty 1

## 2014-07-11 MED ORDER — ONDANSETRON HCL 4 MG/2ML IJ SOLN: 4.0000 mg | Freq: Four times a day (QID) | INTRAMUSCULAR | Status: DC | PRN

## 2014-07-11 MED ORDER — APIXABAN 2.5 MG PO TABS
2.5000 mg | ORAL_TABLET | Freq: Two times a day (BID) | ORAL | Status: DC
  Administered 2014-07-11 – 2014-07-13 (×5): 2.5 mg via ORAL
  Filled 2014-07-11 (×7): qty 1

## 2014-07-11 MED ORDER — BOOST PLUS PO LIQD
237.0000 mL | Freq: Three times a day (TID) | ORAL | Status: DC
Start: 2014-07-11 — End: 2014-07-17
  Administered 2014-07-11 – 2014-07-17 (×11): 237 mL via ORAL
  Filled 2014-07-11 (×23): qty 237

## 2014-07-11 MED ORDER — METOPROLOL TARTRATE 12.5 MG HALF TABLET
12.5000 mg | ORAL_TABLET | Freq: Two times a day (BID) | ORAL | Status: DC
  Administered 2014-07-11 – 2014-07-14 (×6): 12.5 mg via ORAL
  Filled 2014-07-11 (×9): qty 1

## 2014-07-11 NOTE — ED Notes (Signed)
[facility] Lunch tray ordered

## 2014-07-11 NOTE — ED Notes (Signed)
Cards, PA returned call & is calling 6E to discuss floor RNs concern re: telemetry bed request

## 2014-07-11 NOTE — Progress Notes (Signed)
Right lower extremity venous duplex completed.  Right:  No evidence of DVT, superficial thrombosis, or Baker's cyst.  Left:  Negative for DVT in the common femoral vein.  

## 2014-07-11 NOTE — ED Notes (Signed)
Derry Skill, RN aware pt is in route to floor per cardiology

## 2014-07-11 NOTE — Progress Notes (Signed)
Pt HR 140-160 on monitor, atrial flutter, pt with frequent runs NSVT 5-8 beats. Pt asymptomatic, no chest pain, no SOB, no palpitations, pt states she feels relaxed. BP 136/54, O2 100% on 4Lnc. Pt requesting to get out of bed. MD on call paged with cardiology. EKG performed. 21:24  Dr. Percival Spanish notified and new order given to start pt on continuous cardizem gtt at 10mg /hr. Pharmacy called to send dose. 21:32  VSS, Cardizem gtt started, frequent vitals set up in room. Central telemetry notified. Pt still having runs of NSVT, longest 14 beats. . Strips placed in chart. Will continue to monitor. 10:08 PM

## 2014-07-11 NOTE — ED Notes (Signed)
Called [facility]-per RN.

## 2014-07-11 NOTE — ED Notes (Signed)
Alexa Middleton, Cards PA at bedside, currently calling radiology re: not being able to view 2view chest xray results

## 2014-07-11 NOTE — ED Notes (Signed)
Attempted report 

## 2014-07-11 NOTE — ED Notes (Signed)
Lacinda Axon, MD notified re: O 2 saturation <80% on pulse ox, pt placed on 2 L [place] with O2 sats to 88%, pt placed on 4 L [place] O 2 sats at 92%

## 2014-07-11 NOTE — ED Provider Notes (Addendum)
CSN: [id number]     Arrival date & time 07/29/2014  0753 History   First MD Initiated Contact with Patient 07/24/2014 0757     Chief Complaint  Patient presents with  . Palpitations  . Shortness of Breath     (Consider location/radiation/quality/duration/timing/severity/associated sxs/prior Treatment) HPI..... Patient complains of a sense of tachycardia with any movement. This is atypical for her. No frank anterior chest pain, diaphoresis, nausea. Some dyspnea. She is scheduled for liver biopsy on July 6. No known history of coronary artery disease. Severity is moderate. Cardiac risk factors include hypertension, hyperlipidemia, atrial fibrillation.  Patient is on Eliquis.  Past Medical History  Diagnosis Date  . Cancer     skin cancer to left leg  . Hypertension   . HLD (hyperlipidemia)   . Hyponatremia   . Atrial fibrillation     admitted back in August with AF with RVR - started on amiodarone  . Chronic anticoagulation    Past Surgical History  Procedure Laterality Date  . Cholecystectomy    . Abdominal hysterectomy    . Hip arthroplasty Left 09/05/2013    Procedure: ARTHROPLASTY BIPOLAR HIP;  Surgeon: Gearlean Alf, MD;  Location: [facility];  Service: Orthopedics;  Laterality: Left;   Family History  Problem Relation Age of Onset  . CAD Father     died of MI at age 32   History  Substance Use Topics  . Smoking status: Former Smoker    Quit date: 09/05/1968  . Smokeless tobacco: Never Used  . Alcohol Use: 4.2 oz/week    7 Glasses of wine per week     Comment: 1  glass wine daily   OB History    No data available     Review of Systems  All other systems reviewed and are negative.     Allergies  Review of patient's allergies indicates no known allergies.  Home Medications   Prior to Admission medications   Medication Sig Start Date End Date Taking? Authorizing Provider  apixaban (ELIQUIS) 2.5 MG TABS tablet Take 1 tablet (2.5 mg total) by mouth 2 (two) times  daily. 09/27/13  Yes Burtis Junes, NP  atorvastatin (LIPITOR) 10 MG tablet Take 10 mg by mouth daily at 6 PM.   Yes Historical Provider, MD  furosemide (LASIX) 20 MG tablet Take 1 tablet (20 mg total) by mouth daily as needed (prn swelling/weight gain). 10/15/13  Yes Burtis Junes, NP  metoprolol tartrate (LOPRESSOR) 25 MG tablet Take 0.5 tablets (12.5 mg total) by mouth 2 (two) times daily. 09/27/13  Yes Burtis Junes, NP  white petrolatum (VASELINE) GEL Apply 1 application topically daily. To leg   Yes Historical Provider, MD   BP 114/58 mmHg  Pulse 69  Resp 21  SpO2 99% Physical Exam  Constitutional: She is oriented to person, place, and time.  Thin, no acute distress  HENT:  Head: Normocephalic and atraumatic.  Eyes: Conjunctivae and EOM are normal. Pupils are equal, round, and reactive to light.  Neck: Normal range of motion. Neck supple.  Cardiovascular: Normal rate and regular rhythm.   Pulmonary/Chest: Effort normal and breath sounds normal.  Abdominal: Soft. Bowel sounds are normal.  Musculoskeletal: Normal range of motion.  Neurological: She is alert and oriented to person, place, and time.  Skin: Skin is warm and dry.  Psychiatric: She has a normal mood and affect. Her behavior is normal.  Nursing note and vitals reviewed.   ED Course  Procedures (including  critical care time) Labs Review Labs Reviewed  D-DIMER, QUANTITATIVE (NOT AT [facility]) - Abnormal; Notable for the following:    D-Dimer, Quant 1.46 (*)    All other components within normal limits  CBC WITH DIFFERENTIAL/PLATELET - Abnormal; Notable for the following:    Neutrophils Relative % 89 (*)    Lymphocytes Relative 4 (*)    Lymphs Abs 0.3 (*)    All other components within normal limits  COMPREHENSIVE METABOLIC PANEL - Abnormal; Notable for the following:    Sodium 127 (*)    Potassium 5.3 (*)    Chloride 86 (*)    Glucose, Bld 149 (*)    Calcium 8.2 (*)    Total Protein 5.6 (*)    Albumin 3.1 (*)     Alkaline Phosphatase 178 (*)    All other components within normal limits  TROPONIN I - Abnormal; Notable for the following:    Troponin I 0.09 (*)    All other components within normal limits    Imaging Review No results found.   EKG Interpretation   Date/Time:  Friday July 11 2014 07:58:09 EDT Ventricular Rate:  93 PR Interval:  176 QRS Duration: 81 QT Interval:  357 QTC Calculation: 444 R Axis:   -77 Text Interpretation:  Age not entered, assumed to be  81 years old for  purpose of ECG interpretation Sinus rhythm Left atrial enlargement Left  anterior fascicular block Anterior infarct, old Confirmed by Jameil Whitmoyer  MD,  Jahnasia Tatum ([id number]) on 07/17/2014 8:15:44 AM      MDM   Final diagnoses:  Palpitations  Elevated troponin    Patient appears to be in no acute distress. EKG shows no acute changes. Troponin 0.09. Sodium 127. Potassium 5.3. Will consult cardiology.    Nat Christen, MD 08/03/2014 [place], MD 07/20/2014 [phone]

## 2014-07-11 NOTE — Progress Notes (Addendum)
Spoke w/ patient and husband (of 63 years) in the room. Discussed components of DNR. Pt was able to express her wishes, with husband concurring. 1. No chest compressions 2. No shocks 3. No intubations 4. Meds and other non-invasive procedures are OK.  Requested they advise the family of her wishes and write them down.   Pt is to be diuresed and has continence problems at baseline. Because of her small size, frailty and acute illness, as well as the need to get accurate I/O, will go ahead and do the foley cath in the ER.   Rosaria Ferries, PA-C 07/15/2014 11:01 AM Beeper [phone]

## 2014-07-11 NOTE — ED Notes (Addendum)
Patient coming from home with c/o of palpitations ongoing for the last week.  Patient has SOB with movement and activity.

## 2014-07-11 NOTE — Progress Notes (Signed)
Pt HR between 115-155 on Cardizem gtt after 1 hour. Pt resting comfortably in bed in NAD, sleepy but arouses easily, VSS otherwise. Rapid Response RN made aware of patient on floor. Per RR, gave scheduled lopressor.

## 2014-07-11 NOTE — Progress Notes (Signed)
Pt admitted to floor at 1440 from the ED via stretcher.  A&0x4.  Oriented to room.  Call bell at bedside.  Denies pain/discomfor.  Instructed to call for assistance.  Verbalized understanding.  Will continue to monitor.  Tuvia Woodrick,RN.

## 2014-07-11 NOTE — Progress Notes (Addendum)
Called by nursing to clarify order for code status in computer. She is listed as full code but the progress note earlier from Rosaria Ferries indicates limited code per their extensive discussion. I spoke with the patient and she wishes to uphold the well-outlined decisions from earlier: "1. No chest compressions 2. No shocks 3. No intubations 4. Meds and other non-invasive procedures are OK."   Will update status in computer. Dayna Dunn PA-C

## 2014-07-11 NOTE — H&P (Addendum)
History and Physical   Patient ID: Alexa Middleton MRN: [id number], DOB/AGE: October 18, 1927 81 y.o. Date of Encounter: 07/31/2014  Primary Physician: Dwan Bolt, MD Primary Cardiologist: Dr Martinique  Chief Complaint:  Chest pain, SOB, rapid HR  HPI: Alexa Middleton is a 81 y.o. female with a history of PAF, RVR, HTN, HL, hyponatremia, asp PNA, on Eliquis. No hx CAD. Hx abnl LFTs and after [place] and CT, is for needle bx liver later this month.  She has been losing weight, gradually. She has been getting weaker and is using a cane but also needs assistance. Her husband cares for her. She has RLE edema that is there most of the time but goes away at time also. Mostly daytime but also wakes with it sometimes. Compression hose helps but is hard to get on.   She has been having rapid palpitations and chest pain with minimal activity for at least a week. Any exertion brings it on. Always associated w/ rapid HR. The pain is hard for her to describe, but is not sharp and lasts until she sits down. Today, she was afraid she was dying and came to the ER. In the ER, she is resting comfortably.    Past Medical History  Diagnosis Date  . Cancer     skin cancer to left leg  . Hypertension   . HLD (hyperlipidemia)   . Hyponatremia   . Atrial fibrillation     admitted back in August with AF with RVR - started on amiodarone  . Chronic anticoagulation     Surgical History:  Past Surgical History  Procedure Laterality Date  . Cholecystectomy    . Abdominal hysterectomy    . Hip arthroplasty Left 09/05/2013    Procedure: ARTHROPLASTY BIPOLAR HIP;  Surgeon: Gearlean Alf, MD;  Location: [facility];  Service: Orthopedics;  Laterality: Left;     I have reviewed the patient's current medications. Prior to Admission medications   Medication Sig Start Date End Date Taking? Authorizing Provider  apixaban (ELIQUIS) 2.5 MG TABS tablet Take 1 tablet (2.5 mg total) by mouth 2 (two) times daily. 09/27/13   Yes Burtis Junes, NP  atorvastatin (LIPITOR) 10 MG tablet Take 10 mg by mouth daily at 6 PM.   Yes Historical Provider, MD  furosemide (LASIX) 20 MG tablet Take 1 tablet (20 mg total) by mouth daily as needed (prn swelling/weight gain). 10/15/13  Yes Burtis Junes, NP  metoprolol tartrate (LOPRESSOR) 25 MG tablet Take 0.5 tablets (12.5 mg total) by mouth 2 (two) times daily. 09/27/13  Yes Burtis Junes, NP  white petrolatum (VASELINE) GEL Apply 1 application topically daily. To leg   Yes Historical Provider, MD   Scheduled Meds: Continuous Infusions: PRN Meds:.  Allergies: No Known Allergies  History   Social History  . Marital Status: Married    Spouse Name: N/A  . Number of Children: N/A  . Years of Education: N/A   Occupational History  . Not on file.   Social History Main Topics  . Smoking status: Former Smoker    Quit date: 09/05/1968  . Smokeless tobacco: Never Used  . Alcohol Use: 4.2 oz/week    7 Glasses of wine per week     Comment: 1  glass wine daily  . Drug Use: No  . Sexual Activity: Not on file   Other Topics Concern  . Not on file   Social History Narrative    Family History  Problem Relation Age of Onset  . CAD Father     died of MI at age 37   Family Status  Relation Status Death Age  . Father Deceased   . Mother Deceased     Review of Systems:   Full 14-point review of systems otherwise negative except as noted above.  Physical Exam: Blood pressure 145/68, pulse 66, resp. rate 22, SpO2 100 %. General: Well developed, well nourished,female in moderate respiratory distress. Head: Normocephalic, atraumatic, sclera non-icteric, no xanthomas, nares are without discharge. Dentition: poor Neck: No carotid bruits. JVD 12 cm. No thyromegally Lungs: Good expansion bilaterally. without wheezes or rhonchi. Bilateral rales Heart: Regular rate and rhythm with S1 S2.  No S3 or S4.  2/6 murmur, no rubs, or gallops appreciated. Abdomen: Soft,  non-tender, non-distended with normoactive bowel sounds. No hepatomegaly. No rebound/guarding. No obvious abdominal masses. Msk:  Strength and tone appear normal for age. No joint deformities or effusions, no spine or costo-vertebral angle tenderness. Extremities: No clubbing or cyanosis. 2 + RLE edema.  Distal pedal pulses are 2+ in 4 extrem Neuro: Alert and oriented X 3. Moves all extremities spontaneously. No focal deficits noted. Memory is poor. Psych:  Responds to questions appropriately with a normal affect. Skin: No rashes or lesions noted  Labs:   Lab Results  Component Value Date   WBC 7.8 07/22/2014   HGB 13.1 08/05/2014   HCT 39.2 07/23/2014   MCV 87.9 07/27/2014   PLT 384 07/17/2014   No results for input(s): INR in the last 72 hours.  Recent Labs Lab 08/09/2014 0825  NA 127*  K 5.3*  CL 86*  CO2 29  BUN 16  CREATININE 0.79  CALCIUM 8.2*  PROT 5.6*  BILITOT 0.9  ALKPHOS 178*  ALT 21  AST 37  GLUCOSE 149*    Recent Labs  07/27/2014 0825  TROPONINI 0.09*   Lab Results  Component Value Date   DDIMER 1.46* 07/12/2014    Radiology/Studies: Dg Chest 2 View 08/02/2014   CLINICAL DATA:  Palpitations, shortness of breath x1 week  EXAM: CHEST - 2 VIEW  COMPARISON:  09/08/2013  FINDINGS: Progressive interstitial airspace opacities in the right upper lobe and left mid lung. Coarse bibasilar interstitial and airspace opacities have slightly increased. Heart size remains normal. Peripheral interstitial thickening most marked the lung bases right greater than left, increased since prior exam. Small bilateral pleural effusions as before. Atheromatous aorta. Mild upper lumbar spondylitic changes noted.  IMPRESSION: 1. New bilateral asymmetric infiltrates or edema. 2. Small pleural effusions as before   Electronically Signed   By: Lucrezia Europe M.D.   On: 07/19/2014 10:09   Echo: [id number] - Left ventricle: The cavity size was normal. Systolic function was normal. The estimated  ejection fraction was in the range of 60% to 65%. Wall motion was normal; there were no regional wall motion abnormalities. Doppler parameters are consistent with abnormal left ventricular relaxation (grade 1 diastolic dysfunction). Doppler parameters are consistent with elevated ventricular end-diastolic filling pressure. - Aortic valve: Trileaflet; mildly thickened, mildly calcified leaflets. There was no regurgitation. - Aortic root: The aortic root was normal in size. - Mitral valve: Structurally normal valve. There was mild regurgitation. - Right ventricle: Systolic function was normal. - Right atrium: The atrium was normal in size. - Pulmonic valve: There was mild regurgitation. - Pulmonary arteries: Systolic pressure was within the normal range. - Inferior vena cava: The vessel was normal in size. - Pericardium, extracardiac: There was  no pericardial effusion   ECG: 07/27/2014 SR, ST, Q waves V1 and V2 are old  ASSESSMENT AND PLAN:   I have seen this patient with Rosaria Ferries, PA-C. The assessment and plan is formulated by me.   1. Palpitations/Paroxysmal atrial fibrillation: She is in sinus today. Cannot exclude tachy-arrhythmias prior to arrival in the ED. Will admit and monitor on telemetry. Continue Eliquis for anti-coagulation. Continue metoprolol. CHADS -VASC score is 5 with annual stroke risk of 7.2%.   2. Acute on chronic diastolic CHF: Will diurese with IV Lasix. Renal function is normal. Will start Lasix 40 mg IV BID. Check BNP.   3. Elevated troponin: May be secondary to arrhythmias at home. Will cycle troponin and get Echo to assess LV function. She is fully anti-coagulated on Eliquis.   4. Elevated d-dimer with asymmetrical right lower extremity edema: She has been anti-coagulated chronically so would not suspect DVT or PE. Will arrange lower ext venous dopplers to exclude DVT. Could consider CTA to exclude PE but I do not think that is necessary at this time.    5. Liver mass: Plans for CT guided biopsy next week on Wednesday. If we continue with this plan, will need told Eliquis on Monday.   MCALHANY,CHRISTOPHER 07/13/2014 10:49 AM

## 2014-07-11 NOTE — ED Notes (Signed)
Dr Cook at bedside

## 2014-07-12 DIAGNOSIS — I4891 Unspecified atrial fibrillation: Secondary | ICD-10-CM

## 2014-07-12 DIAGNOSIS — I5031 Acute diastolic (congestive) heart failure: Secondary | ICD-10-CM

## 2014-07-12 LAB — COMPREHENSIVE METABOLIC PANEL
ALBUMIN: 2.9 g/dL — AB (ref 3.5–5.0)
ALT: 21 U/L (ref 14–54)
AST: 32 U/L (ref 15–41)
Alkaline Phosphatase: 189 U/L — ABNORMAL HIGH (ref 38–126)
Anion gap: 6 (ref 5–15)
BUN: 15 mg/dL (ref 6–20)
CO2: 40 mmol/L — ABNORMAL HIGH (ref 22–32)
Calcium: 7.9 mg/dL — ABNORMAL LOW (ref 8.9–10.3)
Chloride: 82 mmol/L — ABNORMAL LOW (ref 101–111)
Creatinine, Ser: 0.72 mg/dL (ref 0.44–1.00)
GLUCOSE: 156 mg/dL — AB (ref 65–99)
Potassium: 4.3 mmol/L (ref 3.5–5.1)
Sodium: 128 mmol/L — ABNORMAL LOW (ref 135–145)
Total Bilirubin: 0.5 mg/dL (ref 0.3–1.2)
Total Protein: 5.8 g/dL — ABNORMAL LOW (ref 6.5–8.1)

## 2014-07-12 LAB — BASIC METABOLIC PANEL
Anion gap: 9 (ref 5–15)
BUN: 13 mg/dL (ref 6–20)
CHLORIDE: 81 mmol/L — AB (ref 101–111)
CO2: 39 mmol/L — ABNORMAL HIGH (ref 22–32)
Calcium: 7.8 mg/dL — ABNORMAL LOW (ref 8.9–10.3)
Creatinine, Ser: 0.64 mg/dL (ref 0.44–1.00)
GFR calc Af Amer: >60 mL/min (ref >60)
Glucose, Bld: 158 mg/dL — ABNORMAL HIGH (ref 65–99)
Potassium: 3.8 mmol/L (ref 3.5–5.1)
Sodium: 129 mmol/L — ABNORMAL LOW (ref 135–145)

## 2014-07-12 LAB — POCT I-STAT 3, ART BLOOD GAS (G3+)
ACID-BASE EXCESS: 14 mmol/L — AB (ref 0.0–2.0)
BICARBONATE: 41.5 meq/L — AB (ref 20.0–24.0)
O2 Saturation: 99 %
PCO2 ART: 60.5 mmHg — AB (ref 35.0–45.0)
PH ART: 7.443 (ref 7.350–7.450)
Patient temperature: 97.8
TCO2: 43 mmol/L (ref 0–100)
pO2, Arterial: 133 mmHg — ABNORMAL HIGH (ref 80.0–100.0)

## 2014-07-12 LAB — TROPONIN I: Troponin I: 0.09 ng/mL — ABNORMAL HIGH (ref <0.031)

## 2014-07-12 MED ORDER — DILTIAZEM HCL ER COATED BEADS 120 MG PO CP24
120.0000 mg | ORAL_CAPSULE | Freq: Every day | ORAL | Status: DC
  Administered 2014-07-12 – 2014-07-15 (×4): 120 mg via ORAL
  Filled 2014-07-12 (×4): qty 1

## 2014-07-12 MED ORDER — DEXTROSE 5 % IV SOLN
5.0000 mg/h | INTRAVENOUS | Status: DC
  Filled 2014-07-12: qty 100

## 2014-07-12 NOTE — Progress Notes (Signed)
Pt HR sustaining in 60s, down to 58 non-sustained x2. Cardizem gtt paused and MD called. Per Dr. Percival Spanish, new order to decrease cardizem gtt to 5mg /hr, states OK for HR to be in 60s and down to 50s if patient asymptomatic. Pt with no complaints of shortness of breath, no pain, no dizziness.   Pt BP 108/49. Will continue to monitor. Ronnette Hila, RN

## 2014-07-12 NOTE — Progress Notes (Signed)
Patient is resting comfortably on 2LNC. Patient is in no distress and vitals are stable. BIPAP is not needed at this time. Will continue to monitor.

## 2014-07-12 NOTE — Progress Notes (Signed)
Pt still in A flutter, HR 90-110.

## 2014-07-12 NOTE — Progress Notes (Signed)
Pt converted back to NSR MD notified

## 2014-07-12 NOTE — Progress Notes (Signed)
Pt HR in 70s, BP 97/53. Dr. Percival Spanish notified, per MD to keep cardizem going at 10mg /hr. Will continue to monitor. Ronnette Hila, RN

## 2014-07-12 NOTE — Progress Notes (Addendum)
Luisa Dago PA called informed of conversion to SB HR 50, cardiam drip stopped . BMT showed pCO2 level increased from 29 to in 40's.after a ABG PCO2 OF 62.6 yesterday . Patient transferred via bed on monitor with belongings to 2C01 for BiPAP TX.  Report called.

## 2014-07-12 NOTE — Progress Notes (Signed)
D/C Bipap per order from [place]. Pt placed on 1 L n/c. 02 sats 99 % RR 18 tolerated well. Alert/oriented given po meds and a meal. Husband at bedside

## 2014-07-12 NOTE — Progress Notes (Signed)
Pt assessed this AM, more lethargic and difficult to arouse. previous ABG showed CO2 62.2 and BMET CO2 29, BMET CO2 this AM 40. Pt in no respiratory distress at this time. Rapid response notified and advised to page MD, MD paged, RN went back to pt room and patient awake, straightening out covers and asking RN when breakfast will arrive. MD notified.

## 2014-07-12 NOTE — Progress Notes (Signed)
Pt received from [facility] per bed. CHG bath done Pt alert/oriented. See initial assessment. IV Lt a/c NS @ 10 cc/hr. Pt educated about BiPap explained that she would not be able to eat while on Bipap. Floor says they will alert her husband that she has been transferred to [place]

## 2014-07-12 NOTE — Progress Notes (Signed)
Patient Name: Alexa Middleton Date of Encounter: 07/12/2014  Active Problems:   Atrial fibrillation with RVR   PAF (paroxysmal atrial fibrillation)   Acute diastolic CHF (congestive heart failure), NYHA class 4   Length of Stay: 1  SUBJECTIVE  atrial fibrillation with RVR overnight, resolved after a few hours. On BiPAP due to hypercapnia, looks very comfortable. Suspect some/most of the hypercapnia is chronic as she has chronic metabolic alkalosis  CURRENT MEDS . apixaban  2.5 mg Oral BID  . atorvastatin  10 mg Oral q1800  . furosemide  40 mg Intravenous BID  . lactose free nutrition  237 mL Oral TID WC  . metoprolol tartrate  12.5 mg Oral BID  . white petrolatum  1 application Topical Daily    OBJECTIVE   Intake/Output Summary (Last 24 hours) at 07/12/14 1139 Last data filed at 07/12/14 0500  Gross per 24 hour  Intake    360 ml  Output   2850 ml  Net  -2490 ml   Filed Weights   07/29/2014 1443 07/12/14 0549 07/12/14 0814  Weight: 102 lb 4.8 oz (46.403 kg) 103 lb 3.2 oz (46.811 kg) 78 lb 12.8 oz (35.743 kg)    PHYSICAL EXAM Filed Vitals:   07/12/14 0814 07/12/14 0821 07/12/14 1129 07/12/14 1132  BP:   105/43 105/43  Pulse:  72 64 63  Temp: 97.5 F (36.4 C)  96.4 F (35.8 C)   TempSrc: Axillary  Axillary   Resp:  12 20 11   Height: 5\' 2"  (1.575 m)     Weight: 78 lb 12.8 oz (35.743 kg)     SpO2:  100% 91% 94%   General: Alert, oriented x3, no distress Head: no evidence of trauma, PERRL, EOMI, no exophtalmos or lid lag, no myxedema, no xanthelasma; normal ears, nose and oropharynx Neck: normal jugular venous pulsations and no hepatojugular reflux; brisk carotid pulses without delay and no carotid bruits Chest: clear to auscultation, no signs of consolidation by percussion or palpation, normal fremitus, symmetrical and full respiratory excursions Cardiovascular: normal position and quality of the apical impulse, regular rhythm, normal first and second heart sounds, no  rubs or gallops, no murmur Abdomen: no tenderness or distention, no masses by palpation, no abnormal pulsatility or arterial bruits, normal bowel sounds, no hepatosplenomegaly Extremities: no clubbing, cyanosis or edema; 2+ radial, ulnar and brachial pulses bilaterally; 2+ right femoral, posterior tibial and dorsalis pedis pulses; 2+ left femoral, posterior tibial and dorsalis pedis pulses; no subclavian or femoral bruits Neurological: grossly nonfocal  LABS  CBC  Recent Labs  07/31/2014 0825  WBC 7.8  NEUTROABS 6.9  HGB 13.1  HCT 39.2  MCV 87.9  PLT 366   Basic Metabolic Panel  Recent Labs  07/14/2014 0825 07/12/14 0408  NA 127* 128*  K 5.3* 4.3  CL 86* 82*  CO2 29 40*  GLUCOSE 149* 156*  BUN 16 15  CREATININE 0.79 0.72  CALCIUM 8.2* 7.9*   Liver Function Tests  Recent Labs  07/26/2014 0825 07/12/14 0408  AST 37 32  ALT 21 21  ALKPHOS 178* 189*  BILITOT 0.9 0.5  PROT 5.6* 5.8*  ALBUMIN 3.1* 2.9*   No results for input(s): LIPASE, AMYLASE in the last 72 hours. Cardiac Enzymes  Recent Labs  08/07/2014 1710 07/17/2014 2135 07/12/14 0408  TROPONINI 0.09* 0.10* 0.09*   BNP Invalid input(s): POCBNP D-Dimer  Recent Labs  07/15/2014 0825  DDIMER 1.46*  Thyroid Function Tests  Recent Labs  07/25/2014 1710  TSH 1.583    Radiology Studies Imaging results have been reviewed and Dg Chest 2 View  07/16/2014   CLINICAL DATA:  Palpitations, shortness of breath x1 week  EXAM: CHEST - 2 VIEW  COMPARISON:  09/08/2013  FINDINGS: Progressive interstitial airspace opacities in the right upper lobe and left mid lung. Coarse bibasilar interstitial and airspace opacities have slightly increased. Heart size remains normal. Peripheral interstitial thickening most marked the lung bases right greater than left, increased since prior exam. Small bilateral pleural effusions as before. Atheromatous aorta. Mild upper lumbar spondylitic changes noted.  IMPRESSION: 1. New bilateral  asymmetric infiltrates or edema. 2. Small pleural effusions as before   Electronically Signed   By: Lucrezia Europe M.D.   On: 07/31/2014 10:09    TELE NSR  ECG Sinus bradycardia, Left anterior fascicular block T wave abnormality, consider inferior ischemia  ASSESSMENT AND PLAN  1. Paroxysmal atrial fibrillation: She is in sinus now, atrial fibrillation overnight. Continue Eliquis for anti-coagulation. Continue metoprolol. CHADS -VASC score is 5 with annual stroke risk of 7.2%.   2. Acute on chronic diastolic CHF:  Good response to diuresis with IV Lasix. Renal function is normal. Normal EF , grade 1 diastolic dysfunction in August 2015  3. Elevated troponin: Marginal increase in "plateau" pattern may be secondary to arrhythmia. Pending Echo to assess LV function. She is fully anti-coagulated on Eliquis. If wall motion abnormalities are seen on echo, consider angiography next week. Otherwise, plan Lexiscan Myoview.  4. Elevated d-dimer with asymmetrical right lower extremity edema: No DVT by [place]  5. Liver mass: Plans for CT guided biopsy next week on Wednesday. If we continue with this plan, will need told Eliquis on Monday.   6. Acute on chronic hypercapnic respiratory failure - remote history of 30-pack year smoking, exacerbation due to CHF  Sanda Klein, MD, [facility] [phone] office [phone] pager 07/12/2014 11:39 AM

## 2014-07-12 NOTE — Progress Notes (Addendum)
Critical results of ABG were called to receiving RT of 2C.

## 2014-07-12 NOTE — Progress Notes (Signed)
Pt in at fib/flutter rate 110 to 118 call to Cardiology orders received restarted Cardizem gtt at 5

## 2014-07-12 NOTE — Progress Notes (Signed)
Pt went back into at fib/flutter rate 110 to 118 restarted Cardizem gtt at  5

## 2014-07-12 NOTE — Progress Notes (Signed)
Subjective: No SOB.  Feels pretty good.   Objective: Vital signs in last 24 hours: Temp:  [97 F (36.1 C)-98.1 F (36.7 C)] 97.8 F (36.6 C) (07/02 0549) Pulse Rate:  [59-152] 59 (07/02 0717) Resp:  [13-27] 18 (07/02 0549) BP: (94-145)/(41-87) 104/43 mmHg (07/02 0717) SpO2:  [73 %-100 %] 98 % (07/02 0549) Weight:  [102 lb 4.8 oz (46.403 kg)-103 lb 3.2 oz (46.811 kg)] 103 lb 3.2 oz (46.811 kg) (07/02 0549) Last BM Date: 08/09/2014  Intake/Output from previous day: 07/01 0701 - 07/02 0700 In: 360 [P.O.:360] Out: 2850 [Urine:2850] Intake/Output this shift:    Medications Scheduled Meds: . apixaban  2.5 mg Oral BID  . atorvastatin  10 mg Oral q1800  . furosemide  40 mg Intravenous BID  . lactose free nutrition  237 mL Oral TID WC  . metoprolol tartrate  12.5 mg Oral BID  . white petrolatum  1 application Topical Daily   Continuous Infusions: . [START ON 07/16/2014] sodium chloride    . diltiazem (CARDIZEM) infusion 5 mg/hr (07/12/14 0541)   PRN Meds:.acetaminophen, ALPRAZolam, nitroGLYCERIN, ondansetron (ZOFRAN) IV, zolpidem  PE: General appearance: alert, cooperative and no distress Neck: no JVD Lungs: Coarse crackles throughout Heart: regular rate and rhythm, S1, S2 normal, no murmur, click, rub or gallop Abdomen: +BS, nontender, no distension Extremities: 1+ LEE Pulses: 2+ and symmetric Skin: Warm and dry Neurologic: Grossly normal  Lab Results:   Recent Labs  07/12/2014 0825  WBC 7.8  HGB 13.1  HCT 39.2  PLT 384   BMET  Recent Labs  08/10/2014 0825 07/12/14 0408  NA 127* 128*  K 5.3* 4.3  CL 86* 82*  CO2 29 40*  GLUCOSE 149* 156*  BUN 16 15  CREATININE 0.79 0.72  CALCIUM 8.2* 7.9*      Assessment/Plan  81 y.o. female with a history of PAF, RVR, HTN, HL, hyponatremia, asp PNA, on Eliquis. No hx CAD. Hx abnl LFTs and after [place] and CT, is for needle bx liver later this month.  Presented in Afib RVR.  Active Problems:   Atrial fibrillation  with RVR  Converted to sinus brady.  50's.  Stop Cardizem. Continue lopressor 12.5.  Titrate as HR/BP will allow.    Acute diastolic CHF (congestive heart failure), NYHA class 4  Net fluids:  -2.5L.  No JVD but she has coarse crackles on lung exam.  Give AM IV lasix.    Hypercapnia Start BiPap, ABG.  She is not ventilating well. Transfer to stepdown.  She's stable but they do not do Bipap on this floor.  Recheck Bmet later.   Elevated troponin:  May be secondary to arrhythmias at home. Will cycle troponin and get Echo to assess LV function. She is fully anti-coagulated on Eliquis.   Elevated d-dimer with asymmetrical right lower extremity edema:  She has been anti-coagulated chronically so would not suspect DVT or PE. Will arrange lower ext venous dopplers pending. Could consider CTA to exclude PE but I do not think that is necessary at this time.   Liver mass:  Plans for CT guided biopsy next week on Wednesday. If we continue with this plan, will need told Eliquis on Monday.    LOS: 1 day    HAGER, BRYAN PA-C 07/12/2014 7:34 AM  I have seen and examined the patient along with  HAGER, Karyl Kinnier, PA NP.  I have reviewed the chart, notes and new data.  I agree with PA/NP's note.  See my  note also  Sanda Klein, MD, [place] [phone] 07/12/2014, 11:52 AM

## 2014-07-13 DIAGNOSIS — J9622 Acute and chronic respiratory failure with hypercapnia: Secondary | ICD-10-CM

## 2014-07-13 LAB — BASIC METABOLIC PANEL
Anion gap: 9 (ref 5–15)
BUN: 11 mg/dL (ref 6–20)
CO2: 41 mmol/L — ABNORMAL HIGH (ref 22–32)
Calcium: 8.1 mg/dL — ABNORMAL LOW (ref 8.9–10.3)
Chloride: 82 mmol/L — ABNORMAL LOW (ref 101–111)
Creatinine, Ser: 0.61 mg/dL (ref 0.44–1.00)
GFR calc Af Amer: >60 mL/min (ref >60)
GLUCOSE: 102 mg/dL — AB (ref 65–99)
POTASSIUM: 3.8 mmol/L (ref 3.5–5.1)
SODIUM: 132 mmol/L — AB (ref 135–145)

## 2014-07-13 MED ORDER — FUROSEMIDE 40 MG PO TABS
40.0000 mg | ORAL_TABLET | Freq: Every day | ORAL | Status: DC
  Filled 2014-07-13: qty 1

## 2014-07-13 NOTE — Progress Notes (Signed)
Utilization Review Completed.Raesha Coonrod T7/03/2014  

## 2014-07-13 NOTE — Progress Notes (Signed)
Patient Name: Alexa Middleton Date of Encounter: 07/13/2014  Active Problems:   Atrial fibrillation with RVR   PAF (paroxysmal atrial fibrillation)   Acute diastolic CHF (congestive heart failure), NYHA class 4   Length of Stay: 2  SUBJECTIVE  Much better. Off BiPAP. Maintaining NSR. No CV complaints  CURRENT MEDS . apixaban  2.5 mg Oral BID  . atorvastatin  10 mg Oral q1800  . diltiazem  120 mg Oral Daily  . furosemide  40 mg Intravenous BID  . lactose free nutrition  237 mL Oral TID WC  . metoprolol tartrate  12.5 mg Oral BID  . white petrolatum  1 application Topical Daily    OBJECTIVE   Intake/Output Summary (Last 24 hours) at 07/13/14 1153 Last data filed at 07/13/14 0434  Gross per 24 hour  Intake    490 ml  Output   2060 ml  Net  -1570 ml   Filed Weights   07/12/2014 1443 07/12/14 0549 07/12/14 0814  Weight: 102 lb 4.8 oz (46.403 kg) 103 lb 3.2 oz (46.811 kg) 100 lb 1.4 oz (45.4 kg)    PHYSICAL EXAM Filed Vitals:   07/13/14 0434 07/13/14 0735 07/13/14 0803 07/13/14 1044  BP: 117/42  126/56 113/39  Pulse: 95  124   Temp: 97.5 F (36.4 C) 98.1 F (36.7 C)    TempSrc: Axillary Oral    Resp: 19     Height:      Weight:      SpO2: 92%  96%    General: Alert, oriented x3, no distress Head: no evidence of trauma, PERRL, EOMI, no exophtalmos or lid lag, no myxedema, no xanthelasma; normal ears, nose and oropharynx Neck: normal jugular venous pulsations and no hepatojugular reflux; brisk carotid pulses without delay and no carotid bruits Chest: clear to auscultation, no signs of consolidation by percussion or palpation, normal fremitus, symmetrical and full respiratory excursions Cardiovascular: normal position and quality of the apical impulse, regular rhythm, normal first and second heart sounds, no rubs or gallops, no murmur Abdomen: no tenderness or distention, no masses by palpation, no abnormal pulsatility or arterial bruits, normal bowel sounds, no  hepatosplenomegaly Extremities: no clubbing, cyanosis or edema; 2+ radial, ulnar and brachial pulses bilaterally; 2+ right femoral, posterior tibial and dorsalis pedis pulses; 2+ left femoral, posterior tibial and dorsalis pedis pulses; no subclavian or femoral bruits Neurological: grossly nonfocal LABS  CBC  Recent Labs  08/06/2014 0825  WBC 7.8  NEUTROABS 6.9  HGB 13.1  HCT 39.2  MCV 87.9  PLT 485   Basic Metabolic Panel  Recent Labs  07/12/14 1515 07/13/14 0326  NA 129* 132*  K 3.8 3.8  CL 81* 82*  CO2 39* 41*  GLUCOSE 158* 102*  BUN 13 11  CREATININE 0.64 0.61  CALCIUM 7.8* 8.1*   Liver Function Tests  Recent Labs  07/16/2014 0825 07/12/14 0408  AST 37 32  ALT 21 21  ALKPHOS 178* 189*  BILITOT 0.9 0.5  PROT 5.6* 5.8*  ALBUMIN 3.1* 2.9*   No results for input(s): LIPASE, AMYLASE in the last 72 hours. Cardiac Enzymes  Recent Labs  07/20/2014 1710 08/04/2014 2135 07/12/14 0408  TROPONINI 0.09* 0.10* 0.09*   BNP Invalid input(s): POCBNP D-Dimer  Recent Labs  08/05/2014 0825  DDIMER 1.46*  Thyroid Function Tests  Recent Labs  07/25/2014 1710  TSH 1.583    Radiology Studies Imaging results have been reviewed and No results found.  TELE NSR   ASSESSMENT AND  PLAN  1. Paroxysmal atrial fibrillation: Continue Eliquis for anti-coagulation. Continue metoprolol. CHADS -VASC score is 5 with annual stroke risk of 7.2%.   2. Acute on chronic diastolic CHF: Good response to diuresis with IV Lasix. Total 4L negative fluid balance. Renal function is normal. Hyponatremia improving. Switch to PO diuretic. Normal EF , grade 1 diastolic dysfunction in August 2015  3. Elevated troponin: Marginal increase in "plateau" pattern may be secondary to arrhythmia. Pending Echo to assess LV function. She is fully anti-coagulated on Eliquis. If wall motion abnormalities are seen on echo, consider angiography next week. Otherwise, plan Lexiscan Myoview.  4. Elevated  d-dimer with asymmetrical right lower extremity edema: No DVT by [place]  5. Liver mass: Plans for CT guided biopsy next week on Wednesday. If we continue with this plan, will need told Eliquis on Monday.   6. Acute on chronic hypercapnic respiratory failure - resolved; suspect underlying chronic respiratory acidosis and compensatory metabolic alkalosis; remote history of 30-pack year smoking, exacerbation due to CHF  Transfer telemetry.  Sanda Klein, MD, [facility] [phone] office [phone] pager 07/13/2014 11:53 AM

## 2014-07-13 NOTE — Progress Notes (Signed)
Patient is currently on [facility] and is resting comfortably. She is in no distress and vitals are stable. BIPAP is not needed at this time.

## 2014-07-13 NOTE — Progress Notes (Signed)
Central Tele notified RN that pt went into Afib/ A flutter. EKG was done and MD made aware. No new orders at this time. Will continue to monitor

## 2014-07-13 NOTE — Progress Notes (Signed)
Central tele notified RN that pt was back in NSR.

## 2014-07-14 ENCOUNTER — Inpatient Hospital Stay (HOSPITAL_COMMUNITY): Payer: Medicare Other

## 2014-07-14 DIAGNOSIS — I509 Heart failure, unspecified: Secondary | ICD-10-CM

## 2014-07-14 HISTORY — DX: Heart failure, unspecified: I50.9

## 2014-07-14 LAB — BASIC METABOLIC PANEL
Anion gap: 6 (ref 5–15)
BUN: 11 mg/dL (ref 6–20)
CHLORIDE: 78 mmol/L — AB (ref 101–111)
CO2: 42 mmol/L — ABNORMAL HIGH (ref 22–32)
Calcium: 7.5 mg/dL — ABNORMAL LOW (ref 8.9–10.3)
Creatinine, Ser: 0.44 mg/dL (ref 0.44–1.00)
GFR calc non Af Amer: >60 mL/min (ref >60)
Glucose, Bld: 107 mg/dL — ABNORMAL HIGH (ref 65–99)
Potassium: 3.3 mmol/L — ABNORMAL LOW (ref 3.5–5.1)
Sodium: 126 mmol/L — ABNORMAL LOW (ref 135–145)

## 2014-07-14 MED ORDER — CETYLPYRIDINIUM CHLORIDE 0.05 % MT LIQD
7.0000 mL | Freq: Two times a day (BID) | OROMUCOSAL | Status: DC
  Administered 2014-07-14 – 2014-07-17 (×7): 7 mL via OROMUCOSAL

## 2014-07-14 MED ORDER — POTASSIUM CHLORIDE CRYS ER 20 MEQ PO TBCR
20.0000 meq | EXTENDED_RELEASE_TABLET | Freq: Once | ORAL | Status: AC
  Administered 2014-07-14: 20 meq via ORAL
  Filled 2014-07-14: qty 1

## 2014-07-14 MED ORDER — ENSURE ENLIVE PO LIQD
237.0000 mL | Freq: Two times a day (BID) | ORAL | Status: DC
  Administered 2014-07-15 – 2014-07-17 (×4): 237 mL via ORAL

## 2014-07-14 MED ORDER — POLYETHYLENE GLYCOL 3350 17 G PO PACK
17.0000 g | PACK | Freq: Every day | ORAL | Status: DC
  Administered 2014-07-14 – 2014-07-17 (×4): 17 g via ORAL
  Filled 2014-07-14 (×5): qty 1

## 2014-07-14 MED ORDER — BISACODYL 10 MG RE SUPP: 10.0000 mg | Freq: Every day | RECTAL | Status: DC | PRN

## 2014-07-14 NOTE — Progress Notes (Signed)
Pt has sustaining HR >140s since 7pm per report from day nurse, paged [facility] on call, awaiting response. Pt has no complaints at this time. Bp 112/33mmHg, HR 143, RR 20 and 98% O2 sat at 3LM. We will continue to monitor.

## 2014-07-14 NOTE — Progress Notes (Signed)
Transferred to [address] [place] and her walking cane was placed in the closet. Patient and staff made aware.

## 2014-07-14 NOTE — Progress Notes (Signed)
Report called to [facility].N[facility]on [facility] [place].

## 2014-07-14 NOTE — Evaluation (Signed)
Physical Therapy Evaluation Patient Details Name: Alexa Middleton MRN: [id number] DOB: 1927-06-29 Today's Date: 07/14/2014   History of Present Illness  Patient is a 81 y/o female presents with chest pain, SOB and A-flutter. PMH includes PAF, RVR, HTN, HL, hyponatremia, asp PNA, on Eliquis.  Clinical Impression  Patient presents with generalized weakness, dyspnea on exertion and drop in oxygen saturation and balance deficits impacting safe mobility. PTA, pt using SPC for ambulation however would benefit from RW for balance/support due to pt being high fall risk and balance deficits. Pt eager to return home however not sure husband can provide the necessary assist at home. Would benefit from ST SNF to maximize independence and mobility prior to return home. Will follow acutely to try to prepare pt for d/c home per pt wishes however SNF is safest disposition at this time. If pt decides to go home, will need HHPT and assessment of home 02.    Follow Up Recommendations SNF;Supervision/Assistance - 24 hour (but pt refusing and wants HHPT.)    Equipment Recommendations  None recommended by PT    Recommendations for Other Services       Precautions / Restrictions Precautions Precautions: Fall Restrictions Weight Bearing Restrictions: No      Mobility  Bed Mobility Overal bed mobility: Needs Assistance Bed Mobility: Supine to Sit     Supine to sit: [facility] elevated;Min assist     General bed mobility comments: Min A to get to EOB. Heavy use of rail. Increased time and cues.  Transfers Overall transfer level: Needs assistance Equipment used: Straight cane Transfers: Stand Pivot Transfers Sit to Stand: Min assist Stand pivot transfers: Min assist       General transfer comment: Min A to boost from EOB with 1 UE on bed and other holding SPC. pt afraid to let go of bed so therapist provided hand held assist. SPT bed to [facility] Min A for balance.   Ambulation/Gait Ambulation/Gait assistance:  Min assist Ambulation Distance (Feet): 25 Feet Assistive device: Straight cane Gait Pattern/deviations: Step-to pattern;Decreased stride length;Decreased step length - left;Decreased step length - right;Narrow base of support   Gait velocity interpretation: Below normal speed for age/gender General Gait Details: Pt ambulated within room with [facility] and handheld assist. very unsteady. Sa02 decreased to 85% on 3L/min 02.   Stairs            Wheelchair Mobility    Modified Rankin (Stroke Patients Only)       Balance Overall balance assessment: Needs assistance Sitting-balance support: Feet supported;Bilateral upper extremity supported Sitting balance-Leahy Scale: Fair     Standing balance support: During functional activity Standing balance-Leahy Scale: Poor Standing balance comment: Relient on BUE support for static and dynamic standing.                             Pertinent Vitals/Pain Pain Assessment: No/denies pain    Home Living Family/patient expects to be discharged to:: Private residence Living Arrangements: Spouse/significant other Available Help at Discharge: Family;Available 24 hours/day Type of Home: House Home Access: Stairs to enter Entrance Stairs-Rails: Right Entrance Stairs-Number of Steps: 3 Home Layout: One level Home Equipment: Walker - 2 wheels;Cane - single point;Bedside commode      Prior Function Level of Independence: Independent with assistive device(s)         Comments: Pt using SPC PTA. Husband assists with IADLs and drives.      Hand Dominance  Extremity/Trunk Assessment   Upper Extremity Assessment: Defer to OT evaluation           Lower Extremity Assessment: Generalized weakness         Communication   Communication: No difficulties  Cognition Arousal/Alertness: Awake/alert Behavior During Therapy: WFL for tasks assessed/performed Overall Cognitive Status: Within Functional Limits for tasks  assessed                      General Comments General comments (skin integrity, edema, etc.): Pt does not want to go to rehab.    Exercises        Assessment/Plan    PT Assessment Patient needs continued PT services  PT Diagnosis Difficulty walking;Generalized weakness   PT Problem List Decreased strength;Cardiopulmonary status limiting activity;Decreased activity tolerance;Decreased balance;Decreased mobility  PT Treatment Interventions Balance training;Gait training;Stair training;Functional mobility training;Therapeutic activities;Therapeutic exercise;Patient/family education;DME instruction   PT Goals (Current goals can be found in the Care Plan section) Acute Rehab PT Goals Patient Stated Goal: to go home and get HHPT. PT Goal Formulation: With patient Time For Goal Achievement: 07/28/14 Potential to Achieve Goals: Fair    Frequency Min 3X/week   Barriers to discharge Inaccessible home environment 3 steps to enter home and not sure if husband can provide physical assist.    Co-evaluation               End of Session Equipment Utilized During Treatment: Gait belt;Oxygen Activity Tolerance: Patient tolerated treatment well;Treatment limited secondary to medical complications (Comment) (decrease on 02 sats) Patient left: Other (comment);with call bell/phone within reach (sitting on BSC trying to have a BM.) Nurse Communication:  (Told RN tech pt on [facility] and will need assist back to bed once done. )         Time: 7322-0254 PT Time Calculation (min) (ACUTE ONLY): 32 min   Charges:   PT Evaluation $Initial PT Evaluation Tier I: 1 Procedure PT Treatments $Gait Training: 8-22 mins   PT G Codes:        Carleta Woodrow A Malayzia Laforte 07/14/2014, 4:25 PM Wray Kearns, [place], DPT [phone]

## 2014-07-14 NOTE — Progress Notes (Signed)
RT note: Pt off bipap since 07/12/14.  Rt will continue to monitor,.

## 2014-07-14 NOTE — Progress Notes (Addendum)
Patient Name: Alexa Middleton Date of Encounter: 07/14/2014  Active Problems:   Atrial fibrillation with RVR   PAF (paroxysmal atrial fibrillation)   Acute diastolic CHF (congestive heart failure), NYHA class 4   Length of Stay: 3  SUBJECTIVE  Very weak. Maintaining NSR. Had some Afib/flutter yesterday with controlled rate. In NSR since 8 pm.  No CV complaints  CURRENT MEDS . apixaban  2.5 mg Oral BID  . atorvastatin  10 mg Oral q1800  . diltiazem  120 mg Oral Daily  . furosemide  40 mg Oral Daily  . lactose free nutrition  237 mL Oral TID WC  . metoprolol tartrate  12.5 mg Oral BID  . white petrolatum  1 application Topical Daily    OBJECTIVE   Intake/Output Summary (Last 24 hours) at 07/14/14 0740 Last data filed at 07/14/14 0519  Gross per 24 hour  Intake    480 ml  Output   1525 ml  Net  -1045 ml   Filed Weights   07/12/14 0549 07/12/14 0814 07/14/14 0518  Weight: 46.811 kg (103 lb 3.2 oz) 45.4 kg (100 lb 1.4 oz) 44.271 kg (97 lb 9.6 oz)    PHYSICAL EXAM Filed Vitals:   07/13/14 2141 07/13/14 2323 07/14/14 0400 07/14/14 0518  BP: 110/42 109/35 114/34   Pulse: 70 59 62   Temp:  97.3 F (36.3 C)  97.2 F (36.2 C)  TempSrc:  Oral  Oral  Resp:  20 18   Height:      Weight:    44.271 kg (97 lb 9.6 oz)  SpO2:  97% 100%    General: Alert, oriented x3, no distress Head: no evidence of trauma, PERRL, EOMI, no exophtalmos or lid lag, no myxedema, no xanthelasma; normal ears, nose and oropharynx Neck: normal jugular venous pulsations and no hepatojugular reflux; brisk carotid pulses without delay and no carotid bruits Chest: clear to auscultation, no signs of consolidation by percussion or palpation, normal fremitus, symmetrical and full respiratory excursions Cardiovascular: normal position and quality of the apical impulse, regular rhythm, normal first and second heart sounds, no rubs or gallops, no murmur Abdomen: no tenderness or distention, no masses by  palpation, no abnormal pulsatility or arterial bruits, normal bowel sounds, no hepatosplenomegaly Extremities: no clubbing, cyanosis or edema; 2+ radial, ulnar and brachial pulses bilaterally; 2+ right femoral, posterior tibial and dorsalis pedis pulses; 2+ left femoral, posterior tibial and dorsalis pedis pulses; no subclavian or femoral bruits Neurological: grossly nonfocal LABS  CBC  Recent Labs  07/27/2014 0825  WBC 7.8  NEUTROABS 6.9  HGB 13.1  HCT 39.2  MCV 87.9  PLT 169   Basic Metabolic Panel  Recent Labs  07/13/14 0326 07/14/14 0235  NA 132* 126*  K 3.8 3.3*  CL 82* 78*  CO2 41* 42*  GLUCOSE 102* 107*  BUN 11 11  CREATININE 0.61 0.44  CALCIUM 8.1* 7.5*   Liver Function Tests  Recent Labs  08/08/2014 0825 07/12/14 0408  AST 37 32  ALT 21 21  ALKPHOS 178* 189*  BILITOT 0.9 0.5  PROT 5.6* 5.8*  ALBUMIN 3.1* 2.9*   No results for input(s): LIPASE, AMYLASE in the last 72 hours. Cardiac Enzymes  Recent Labs  07/17/2014 1710 07/30/2014 2135 07/12/14 0408  TROPONINI 0.09* 0.10* 0.09*   BNP Invalid input(s): POCBNP D-Dimer  Recent Labs  07/12/2014 0825  DDIMER 1.46*  Thyroid Function Tests  Recent Labs  08/02/2014 1710  TSH 1.583    Radiology Studies  CLINICAL DATA: Subsequent encounter for weight loss with lesions seen in left hepatic lobe on recent ultrasound exam.  EXAM: CT ABDOMEN AND PELVIS WITH CONTRAST  TECHNIQUE: Multidetector CT imaging of the abdomen and pelvis was performed using the standard protocol following bolus administration of intravenous contrast.  CONTRAST: 168mL ISOVUE-300 IOPAMIDOL (ISOVUE-300) INJECTION 61%  COMPARISON: Ultrasound exam from 06/24/2014.  FINDINGS: Lower chest: Advanced changes of centrilobular and paraseptal emphysema.  Hepatobiliary: As seen on ultrasound, there are multiple lesions involving the left liver. The dominant heterogeneously enhancing lesion measures 3.3 x 5.5 cm. Other  lesions in the left liver range in size from 10 mm up to about 20 mm. This is associated with intrahepatic biliary duct dilatation mainly involving the lateral segment left liver.  Gallbladder is surgically absent. There is no extrahepatic biliary duct dilatation.  Pancreas: There is mild diffuse distention of the main pancreatic duct. Pancreatic head is not well discriminated secondary to distortion of central abdominal anatomy from the left hepatic lobe enlargement and lack of intra-abdominal fat.  Spleen: No splenomegaly. No focal mass lesion.  Adrenals/Urinary Tract: No adrenal nodule or mass. No enhancing lesion or hydronephrosis in either kidney. No evidence for hydroureter. Urinary bladder is unremarkable.  Stomach/Bowel: Stomach is nondistended. Duodenum is distorted by mass effect from the left hepatic lobe mass, but there is no duodenal obstruction. No small bowel wall thickening. No small bowel dilatation. Terminal ileum is normal. The appendix is not visualized, but there is no edema or inflammation in the region of the cecum. Diverticular changes are noted in the left colon without evidence of diverticulitis.  Vascular/Lymphatic: There is abdominal aortic atherosclerosis without aneurysm.  Necrotic lymphadenopathy is seen in the hepatoduodenal ligament. 1.4 x 2.5 cm lymph node is seen on image 24. 1.4 x 2.3 cm aortocaval lymph node is visible on image 23. There is some abnormal soft tissue attenuation around the celiac axis with some small lymph nodes seen in the celiac axis. 8 mm short axis left para-aortic lymph node is associated. No evidence for pelvic sidewall lymphadenopathy.  Reproductive: Uterus is surgically absent. No evidence for adnexal mass.  Other: No intraperitoneal free fluid.  Musculoskeletal: Patient is status post left total hip replacement. Bones are diffusely demineralized.  IMPRESSION: Multiple bulky lesions mainly in the  lateral segment left liver with some involvement of the medial segment left liver. This is associated with necrotic lymphadenopathy in the hepatoduodenal ligament and retroperitoneal space of the abdomen. Main differential considerations would include a primary hepatic neoplasm ([place]) although no overt sequelae of cirrhosis are evident. Peripheral cholangiocarcinoma would be a consideration. Metastatic disease from nonvisualized primary (such as pancreatic, GI, or breast) would also be a consideration. The left hepatic lesion should be amenable to percutaneous tissue sampling, as clinically warranted.   Electronically Signed  By: Misty Stanley M.D.  On: 07/02/2014 15:07           Vitals     Height Weight BMI (Calculated)    5\' 2"  (1.575 m) 44.271 kg (97 lb 9.6 oz) 18.3      Interpretation Summary     CLINICAL DATA: Subsequent encounter for weight loss with lesions seen in left hepatic lobe on recent ultrasound exam.  EXAM: CT ABDOMEN AND PELVIS WITH CONTRAST  TECHNIQUE: Multidetector CT imaging of the abdomen and pelvis was performed using the standard protocol following bolus administration of intravenous contrast.  CONTRAST: 182mL ISOVUE-300 IOPAMIDOL (ISOVUE-300) INJECTION 61%  COMPARISON: Ultrasound exam from 06/24/2014.  FINDINGS: Lower chest:  Advanced changes of centrilobular and paraseptal emphysema.  Hepatobiliary: As seen on ultrasound, there are multiple lesions involving the left liver. The dominant heterogeneously enhancing lesion measures 3.3 x 5.5 cm. Other lesions in the left liver range in size from 10 mm up to about 20 mm. This is associated with intrahepatic biliary duct dilatation mainly involving the lateral segment left liver.  Gallbladder is surgically absent. There is no extrahepatic biliary duct dilatation.  Pancreas: There is mild diffuse distention of the main pancreatic duct. Pancreatic head is not well  discriminated secondary to distortion of central abdominal anatomy from the left hepatic lobe enlargement and lack of intra-abdominal fat.  Spleen: No splenomegaly. No focal mass lesion.  Adrenals/Urinary Tract: No adrenal nodule or mass. No enhancing lesion or hydronephrosis in either kidney. No evidence for hydroureter. Urinary bladder is unremarkable.  Stomach/Bowel: Stomach is nondistended. Duodenum is distorted by mass effect from the left hepatic lobe mass, but there is no duodenal obstruction. No small bowel wall thickening. No small bowel dilatation. Terminal ileum is normal. The appendix is not visualized, but there is no edema or inflammation in the region of the cecum. Diverticular changes are noted in the left colon without evidence of diverticulitis.  Vascular/Lymphatic: There is abdominal aortic atherosclerosis without aneurysm.  Necrotic lymphadenopathy is seen in the hepatoduodenal ligament. 1.4 x 2.5 cm lymph node is seen on image 24. 1.4 x 2.3 cm aortocaval lymph node is visible on image 23. There is some abnormal soft tissue attenuation around the celiac axis with some small lymph nodes seen in the celiac axis. 8 mm short axis left para-aortic lymph node is associated. No evidence for pelvic sidewall lymphadenopathy.  Reproductive: Uterus is surgically absent. No evidence for adnexal mass.  Other: No intraperitoneal free fluid.  Musculoskeletal: Patient is status post left total hip replacement. Bones are diffusely demineralized.  IMPRESSION: Multiple bulky lesions mainly in the lateral segment left liver with some involvement of the medial segment left liver. This is associated with necrotic lymphadenopathy in the hepatoduodenal ligament and retroperitoneal space of the abdomen. Main differential considerations would include a primary hepatic neoplasm ([place]) although no overt sequelae of cirrhosis are evident. Peripheral cholangiocarcinoma would  be a consideration. Metastatic disease from nonvisualized primary (such as pancreatic, GI, or breast) would also be a consideration. The left hepatic lesion should be amenable to percutaneous tissue sampling, as clinically warranted.   Electronically Signed  By: Misty Stanley M.D.  On: 07/02/2014 15:07    US ABDOMEN LIMITED - RIGHT UPPER QUADRANT  COMPARISON: CT scan of the abdomen dated 03/16/2007  FINDINGS: Gallbladder:  Removed  Common bile duct:  Diameter: 2.6 mm, normal.  Liver:  There are multiple solid poorly defined masses in the left lobe of the liver as well as several complex primarily solid masses in the left lobe with marked dilatation of the bile ducts in the enlarged inferior aspect of the left lobe.  IMPRESSION: Multiple masses in the left lobe of the liver with intrahepatic ductal dilatation. Findings are very worrisome for carcinoma. CT scan of the abdomen and pelvis with IV and oral contrast is recommended for further characterization.   Electronically Signed  By: Lorriane Shire M.D.  On: 06/24/2014 09:58        TELE NSR   ASSESSMENT AND PLAN  1. Paroxysmal atrial fibrillation: On Eliquis for anti-coagulation. Rate controlled when in Afib/flutter.  Continue metoprolol, diltiazem. CHADS -VASC score is 5 with annual stroke risk of 7.2%. Will need to hold  Eliquis for planned liver biopsy on 07/16/14.  2. Acute on chronic diastolic CHF: Good response to diuresis with IV Lasix. Total 5L negative fluid balance. Weight down 5 lbs. Renal function is normal. More hyponatremic and alkalotic. Will hold diuretics today and reassess resuming oral lasix in am.  Normal EF , grade 1 diastolic dysfunction in August 2015. Repeat Echo pending.  3. Elevated troponin: Marginal increase in "plateau" pattern may be secondary to arrhythmia. Pending Echo to assess LV function. She is fully anti-coagulated on Eliquis. If LV function is OK I would  recommend conservative therapy only without further ischemic work up especially in light of probable malignancy.  4. Elevated d-dimer with asymmetrical right lower extremity edema: No DVT by [place]  5. Liver mass: Plans for US guided biopsy this week on Wednesday. Will hold Eliquis today. Will need to contact Radiology on Tuesday to confirm plan and to let them know she is now inpatient.   6. Acute on chronic hypercapnic respiratory failure - resolved; suspect underlying chronic respiratory acidosis and compensatory metabolic alkalosis; remote history of 30-pack year smoking, exacerbation due to CHF  7. Hyponatremia. Hold lasix today. Restrict fluid.  8. Hypokalemia- replete  9. Deconditioning- Patient very weak. Recent significant weight loss. Possibly related to malignancy. She lives with husband. I spoke with him today and he feels he cannot take care of her at home. May need to consider Nursing facility. Will consult PT and OT. Case management.   Transfer telemetry.  Peter Martinique MD, [facility] [place] [facility]   [facility] [phone] office [phone] pager 07/14/2014 7:40 AM

## 2014-07-14 NOTE — Progress Notes (Addendum)
PT Cancellation Note  Patient Details Name: Alexa Middleton MRN: [id number] DOB: Jun 04, 1927   Cancelled Treatment:    Reason Eval/Treat Not Completed: Patient not medically ready Holding PT evaluation as pt on bedrest with bathroom privileges. Will await increase in activity orders prior to PT evaluation.  Pt getting echo. Will follow up next available time.   Lashonne Shull A Korinne Greenstein 07/14/2014, 1:12 PM Wray Kearns, [place], DPT [phone]

## 2014-07-14 NOTE — Progress Notes (Signed)
Initial Nutrition Assessment  DOCUMENTATION CODES:  Severe malnutrition in context of chronic illness Underweight  INTERVENTION:  Ensure Enlive (each supplement provides 350kcal and 20 grams of protein)  NUTRITION DIAGNOSIS:  Malnutrition related to chronic illness as evidenced by severe depletion of body fat, severe depletion of muscle mass, 19% percent weight loss x 7 months, energy intake < 75% for > or equal to 1 month.   GOAL:  Patient will meet greater than or equal to 90% of their needs   MONITOR:  PO intake, Supplement acceptance, Weight trends, Labs  REASON FOR ASSESSMENT:  Malnutrition Screening Tool    ASSESSMENT:  Pt with hx of PAF, RVR, HTN, hyponatremia, aspiration PNA who was admitted with chest pain, SOB. Pt has hx of abnormal LFTs with recent US and CT (liver mass) with plans for needle bx liver.   Pt has lost weight and is very weak, her husband cannot care for her at home, plans for SNF at d/c per notes.   Labs reviewed: Sodium and Potassium low Medications reviewed and include: KDur  Per pt she began losing weight around 12/15 and has lost a total of 19% of her body weight in 7 months.  24 hr recall: Breakfast: egg or waffle Lunch: soup Dinner: take out her husband brings home, last meal: yams and green beans  Height:  Ht Readings from Last 1 Encounters:  07/14/14 5\' 2"  (1.575 m)    Weight:  Wt Readings from Last 1 Encounters:  07/14/14 99 lb 6.8 oz (45.1 kg)    Ideal Body Weight:  50 kg  Wt Readings from Last 10 Encounters:  07/14/14 99 lb 6.8 oz (45.1 kg)  01/08/14 109 lb (49.442 kg)  10/15/13 113 lb 12.8 oz (51.619 kg)  09/27/13 115 lb 6.4 oz (52.345 kg)  09/26/13 115 lb (52.164 kg)  09/18/13 123 lb 6.4 oz (55.974 kg)  09/10/13 131 lb 6.3 oz (59.6 kg)    BMI:  Body mass index is 18.18 kg/(m^2).  Estimated Nutritional Needs:  Kcal:  1400-1600  Protein:  70-80 grams  Fluid:  > 1.5 L/day  Skin:  Reviewed, no  issues  Diet Order:  Diet 2 gram sodium Room service appropriate?: Yes; Fluid consistency:: Thin; Fluid restriction:: 1500 mL Fluid  EDUCATION NEEDS:  No education needs identified at this time   Intake/Output Summary (Last 24 hours) at 07/14/14 1204 Last data filed at 07/14/14 1002  Gross per 24 hour  Intake      0 ml  Output   1025 ml  Net  -1025 ml    Last BM:  7/3  [place], [place], [place] Pager [phone] After Hours Pager

## 2014-07-14 NOTE — Progress Notes (Signed)
   07/14/14 2100  Clinical Encounter Type  Visited With Health care provider;Patient not available  Visit Type Psychological support;Spiritual support  Referral From Nurse  Consult/Referral To Chaplain  Spiritual Encounters  Spiritual Needs Emotional  Stress Factors  Patient Stress Factors Major life changes  Pt resting; [place] respond to consult; spoke with RN; PT to have liver biopsy by Wed, Spiritual care follow-up required.

## 2014-07-14 NOTE — Care Management (Signed)
Important Message  Patient Details  Name: Alexa Middleton MRN: [id number] Date of Birth: 1927-11-11   Medicare Important Message Given:  Yes-second notification given    Loann Quill 07/14/2014, 9:09 AM

## 2014-07-15 ENCOUNTER — Other Ambulatory Visit: Payer: Self-pay | Admitting: Radiology

## 2014-07-15 ENCOUNTER — Encounter (HOSPITAL_COMMUNITY): Payer: Self-pay | Admitting: General Practice

## 2014-07-15 DIAGNOSIS — R160 Hepatomegaly, not elsewhere classified: Secondary | ICD-10-CM

## 2014-07-15 DIAGNOSIS — E785 Hyperlipidemia, unspecified: Secondary | ICD-10-CM

## 2014-07-15 DIAGNOSIS — I10 Essential (primary) hypertension: Secondary | ICD-10-CM

## 2014-07-15 LAB — CBC
HCT: 41.4 % (ref 36.0–46.0)
Hemoglobin: 13.2 g/dL (ref 12.0–15.0)
MCH: 28.6 pg (ref 26.0–34.0)
MCHC: 31.9 g/dL (ref 30.0–36.0)
MCV: 89.8 fL (ref 78.0–100.0)
Platelets: 354 10*3/uL (ref 150–400)
RBC: 4.61 MIL/uL (ref 3.87–5.11)
RDW: 14.2 % (ref 11.5–15.5)
WBC: 11 10*3/uL — ABNORMAL HIGH (ref 4.0–10.5)

## 2014-07-15 LAB — BASIC METABOLIC PANEL
Anion gap: 7 (ref 5–15)
BUN: 12 mg/dL (ref 6–20)
CALCIUM: 7.9 mg/dL — AB (ref 8.9–10.3)
CO2: 40 mmol/L — AB (ref 22–32)
Chloride: 82 mmol/L — ABNORMAL LOW (ref 101–111)
Creatinine, Ser: 0.48 mg/dL (ref 0.44–1.00)
GFR calc Af Amer: >60 mL/min (ref >60)
GFR calc non Af Amer: >60 mL/min (ref >60)
GLUCOSE: 176 mg/dL — AB (ref 65–99)
POTASSIUM: 4.3 mmol/L (ref 3.5–5.1)
Sodium: 129 mmol/L — ABNORMAL LOW (ref 135–145)

## 2014-07-15 MED ORDER — METOPROLOL TARTRATE 1 MG/ML IV SOLN
5.0000 mg | INTRAVENOUS | Status: DC | PRN
  Administered 2014-07-15: 5 mg via INTRAVENOUS
  Filled 2014-07-15: qty 5

## 2014-07-15 MED ORDER — FUROSEMIDE 20 MG PO TABS
20.0000 mg | ORAL_TABLET | Freq: Every day | ORAL | Status: DC
  Administered 2014-07-16 – 2014-07-17 (×2): 20 mg via ORAL
  Filled 2014-07-15 (×3): qty 1

## 2014-07-15 MED ORDER — SODIUM CHLORIDE 0.9 % IV SOLN
INTRAVENOUS | Status: DC
  Administered 2014-07-16: 06:00:00 via INTRAVENOUS

## 2014-07-15 MED ORDER — DILTIAZEM HCL ER COATED BEADS 240 MG PO CP24
240.0000 mg | ORAL_CAPSULE | Freq: Every day | ORAL | Status: DC
Start: 2014-07-16 — End: 2014-07-18
  Administered 2014-07-16 – 2014-07-17 (×2): 240 mg via ORAL
  Filled 2014-07-15 (×3): qty 1

## 2014-07-15 MED ORDER — METOPROLOL TARTRATE 12.5 MG HALF TABLET
12.5000 mg | ORAL_TABLET | Freq: Four times a day (QID) | ORAL | Status: DC
  Administered 2014-07-15 – 2014-07-17 (×8): 12.5 mg via ORAL
  Filled 2014-07-15 (×13): qty 1

## 2014-07-15 MED ORDER — SOTALOL HCL 80 MG PO TABS
80.0000 mg | ORAL_TABLET | Freq: Two times a day (BID) | ORAL | Status: DC
Start: 2014-07-15 — End: 2014-07-18
  Administered 2014-07-15 – 2014-07-17 (×5): 80 mg via ORAL
  Filled 2014-07-15 (×7): qty 1

## 2014-07-15 NOTE — Progress Notes (Signed)
Pt HR >140s sustaining, again. No complaints made. VSS. Paged MD on call, awaiting response. We will continue to monitor.

## 2014-07-15 NOTE — Progress Notes (Signed)
Pt A-flutter on monitor but remains asymptomatic; NP Sharolyn Douglas notified; new order received. Will closely monitor pt. Francis Gaines Sharia Averitt RN.

## 2014-07-15 NOTE — Progress Notes (Signed)
Pt on lopressor and sotalol. First dose sotalol scheduled tonight. Spoke with pharmacy about using both metoprolol and sotalol together. Pharmacy spoke with Dr. Wynonia Lawman and stated OK to hold lopressor tonight and give sotalol, to clarify with EP MD in AM. Pt educated and verbalized understanding.

## 2014-07-15 NOTE — Consult Note (Addendum)
ELECTROPHYSIOLOGY CONSULT NOTE    Patient ID: Alexa Middleton MRN: [id number], DOB/AGE: 09/30/1927 81 y.o.  Admit date: 07/13/2014 Date of Consult: 07/15/2014  Primary Physician: Dwan Bolt, MD Primary Cardiologist: [place]  Reason for Consultation: atrial arrhythmias  HPI:  Alexa Middleton is a 81 y.o. female with a past medical history significant for hypertension, diastolic heart failure, hyperlipidemia and liver mass pending workup.  She was admitted 08/09/2014 with weakness, shortness of breath, and atrial flutter with RVR.  She was previously maintained on amiodarone but this was discontinued 2/2 elevated liver enzymes.  She has been diuresed with significant improvement in shortness of breath and Eliquis is currently on hold for liver biopsy tomorrow.  EP has been asked to assist with rhythm management.    Last echo 07/14/14 demonstrated EF 65%, no RWMA, PA pressure 74, LA 30.  Lab work is reviewed.   She currently denies chest pain, improving shortness of breath, no recent fevers, chills, nausea or vomiting.   Past Medical History  Diagnosis Date  . Cancer     skin cancer to left leg  . Hypertension   . HLD (hyperlipidemia)   . Hyponatremia   . Atrial fibrillation     admitted back in August with AF with RVR - started on amiodarone  . Chronic anticoagulation   . CHF (congestive heart failure) 07/14/2014    nyha class 4     Surgical History:  Past Surgical History  Procedure Laterality Date  . Cholecystectomy    . Abdominal hysterectomy    . Hip arthroplasty Left 09/05/2013    Procedure: ARTHROPLASTY BIPOLAR HIP;  Surgeon: Gearlean Alf, MD;  Location: [facility];  Service: Orthopedics;  Laterality: Left;     Prescriptions prior to admission  Medication Sig Dispense Refill Last Dose  . apixaban (ELIQUIS) 2.5 MG TABS tablet Take 1 tablet (2.5 mg total) by mouth 2 (two) times daily. 180 tablet 3 07/23/2014 at Unknown time  . atorvastatin (LIPITOR) 10 MG tablet Take 10 mg by  mouth daily at 6 PM.   07/10/2014 at Unknown time  . furosemide (LASIX) 20 MG tablet Take 1 tablet (20 mg total) by mouth daily as needed (prn swelling/weight gain). 90 tablet 3 07/10/2014 at Unknown time  . metoprolol tartrate (LOPRESSOR) 25 MG tablet Take 0.5 tablets (12.5 mg total) by mouth 2 (two) times daily. 90 tablet 3 07/22/2014 at 0700  . white petrolatum (VASELINE) GEL Apply 1 application topically daily. To leg   08/08/2014 at Unknown time    Inpatient Medications:  . antiseptic oral rinse  7 mL Mouth Rinse BID  . atorvastatin  10 mg Oral q1800  . diltiazem  120 mg Oral Daily  . feeding supplement (ENSURE ENLIVE)  237 mL Oral BID BM  . [START ON 07/16/2014] furosemide  20 mg Oral Daily  . lactose free nutrition  237 mL Oral TID WC  . metoprolol tartrate  12.5 mg Oral Q6H  . polyethylene glycol  17 g Oral Daily  . white petrolatum  1 application Topical Daily    Allergies: No Known Allergies  History   Social History  . Marital Status: Married    Spouse Name: N/A  . Number of Children: N/A  . Years of Education: N/A   Occupational History  . Not on file.   Social History Main Topics  . Smoking status: Former Smoker    Quit date: 09/05/1968  . Smokeless tobacco: Never Used  . Alcohol Use: 4.2  oz/week    7 Glasses of wine per week     Comment: 1  glass wine daily  . Drug Use: No  . Sexual Activity: Not on file   Other Topics Concern  . Not on file   Social History Narrative     Family History  Problem Relation Age of Onset  . CAD Father     died of MI at age 86     Review of Systems: All other systems reviewed and are otherwise negative except as noted above.  Physical Exam: Filed Vitals:   07/15/14 0406 07/15/14 0441 07/15/14 0749 07/15/14 1040  BP: 110/52   117/50  Pulse: 142 88  114  Temp:    97.5 F (36.4 C)  TempSrc:    Oral  Resp:    20  Height:      Weight:   98 lb 9.6 oz (44.725 kg)   SpO2:    94%    GEN- The patient is thin and ill  appearing, alert and oriented x 3 today.   HEENT: normocephalic, atraumatic; sclera clear, conjunctiva pink; hearing intact; oropharynx clear; neck supple  Lungs- normal work of breathing, decreased breath sounds Heart- Tachycardic irregular rate and rhythm, no murmurs, rubs or gallops  GI- soft, +tenderness, non-distended, bowel sounds present  Extremities- no clubbing, cyanosis, 1+ edema  MS- no significant deformity or atrophy Skin- warm and dry, no rash or lesion Psych- euthymic mood, full affect Neuro- strength and sensation are intact  Labs:   Lab Results  Component Value Date   WBC 11.0* 07/15/2014   HGB 13.2 07/15/2014   HCT 41.4 07/15/2014   MCV 89.8 07/15/2014   PLT 354 07/15/2014    Recent Labs Lab 07/12/14 0408  07/15/14 0504  NA 128*  < > 129*  K 4.3  < > 4.3  CL 82*  < > 82*  CO2 40*  < > 40*  BUN 15  < > 12  CREATININE 0.72  < > 0.48  CALCIUM 7.9*  < > 7.9*  PROT 5.8*  --   --   BILITOT 0.5  --   --   ALKPHOS 189*  --   --   ALT 21  --   --   AST 32  --   --   GLUCOSE 156*  < > 176*  < > = values in this interval not displayed.    Radiology/Studies: Dg Chest 2 View 07/23/2014   CLINICAL DATA:  Palpitations, shortness of breath x1 week  EXAM: CHEST - 2 VIEW  COMPARISON:  09/08/2013  FINDINGS: Progressive interstitial airspace opacities in the right upper lobe and left mid lung. Coarse bibasilar interstitial and airspace opacities have slightly increased. Heart size remains normal. Peripheral interstitial thickening most marked the lung bases right greater than left, increased since prior exam. Small bilateral pleural effusions as before. Atheromatous aorta. Mild upper lumbar spondylitic changes noted.  IMPRESSION: 1. New bilateral asymmetric infiltrates or edema. 2. Small pleural effusions as before   Electronically Signed   By: Lucrezia Europe M.D.   On: 08/10/2014 10:09   EKG:2:1 atrial flutter, ventricular rate 146  TELEMETRY: atrial fibrillation and atrial  flutter, ventricular rates 90-140's  Assessment/Plan: 1.  Atrial arrhythmias The patient has persistent atrial fibrillation and atrial flutter and has failed medical therapy with amiodarone (elevated LFT's). She has been chronically anticoagulated but her Eliquis is currently on hold for liver biopsy tomorrow.  With PA pressure of 74, likelihood of  recurrent atrial arrhythmias is high. With co-morbidities, she is not currently a candidate for ablation.  Will discuss with Dr Rayann Heman alternative AAD therapy Continue anticoagulation for CHADS2VASC of at least 4  2.  Acute on chronic diastolic heart failure Improved with diuresis  3.  Liver mass Pending biopsy tomorrow  Eliquis on hold   Dr Rayann Heman to see today   Signed, Chanetta Marshall, NP 07/15/2014 11:20 AM  I have seen, examined the patient, and reviewed the above assessment and plan.  On exam, comfortable appearing but in afib with RVR.  AF began yesterday at 6.  I would like to pursue sinus, though our window may be limited off of anticoagulation (held for liver bx).  Changes to above are made where necessary.   For now, will increase diltiazem and add sotalol 80mg  BID.  OK to proceed with liver bx from my standpoint.  I think that her workup for this should probably take precedent over her chronic afib.   Co Sign: Thompson Grayer, MD 07/15/2014 2:56 PM

## 2014-07-15 NOTE — Evaluation (Signed)
Occupational Therapy Evaluation Patient Details Name: Alexa Middleton MRN: [id number] DOB: Oct 21, 1927 Today's Date: 07/15/2014    History of Present Illness Patient is a 81 y/o female presents with chest pain, SOB and A-flutter. PMH includes PAF, RVR, HTN, HL, hyponatremia, asp PNA, on Eliquis.   Clinical Impression   Pt admitted with CP. Pt currently with functional limitations due to the deficits listed below (see OT Problem List).  Pt will benefit from skilled OT to increase their safety and independence with ADL and functional mobility for ADL to facilitate discharge to venue listed below.      Follow Up Recommendations  SNF    Equipment Recommendations  None recommended by OT       Precautions / Restrictions Precautions Precautions: Fall      Mobility Bed Mobility Overal bed mobility: Needs Assistance Bed Mobility: Supine to Sit     Supine to sit: [facility] elevated;Min assist     General bed mobility comments: Min A to get to EOB. Heavy use of rail. Increased time and cues.  Transfers Overall transfer level: Needs assistance Equipment used: Straight cane;1 person hand held assist;Rolling walker (2 wheeled) Transfers: Sit to/from Stand Sit to Stand: Mod assist                   ADL Overall ADL's : Needs assistance/impaired     Grooming: Set up;Sitting           Upper Body Dressing : Set up;Sitting   Lower Body Dressing: Maximal assistance;Sit to/from stand   Toilet Transfer: Moderate assistance;BSC;RW   Toileting- Clothing Manipulation and Hygiene: Moderate assistance;Sit to/from stand         General ADL Comments: Feel pt needs ST SNF to increase I prior to DC home               Pertinent Vitals/Pain Pain Assessment: No/denies pain     Hand Dominance     Extremity/Trunk Assessment Upper Extremity Assessment Upper Extremity Assessment: Generalized weakness           Communication Communication Communication: No difficulties    Cognition Arousal/Alertness: Awake/alert Behavior During Therapy: WFL for tasks assessed/performed Overall Cognitive Status: Within Functional Limits for tasks assessed                                Home Living Family/patient expects to be discharged to:: Private residence Living Arrangements: Spouse/significant other Available Help at Discharge: Family;Available 24 hours/day Type of Home: House Home Access: Stairs to enter [employer] of Steps: 3 Entrance Stairs-Rails: Right Home Layout: One level     Bathroom Shower/Tub: Occupational psychologist: Standard     Home Equipment: Environmental consultant - 2 wheels;Cane - single point;Bedside commode          Prior Functioning/Environment Level of Independence: Independent with assistive device(s)        Comments: Pt using SPC PTA. Husband assists with IADLs and drives.     OT Diagnosis: Generalized weakness   OT Problem List: Decreased strength;Decreased activity tolerance;Impaired balance (sitting and/or standing)   OT Treatment/Interventions: Self-care/ADL training;DME and/or AE instruction;Patient/family education    OT Goals(Current goals can be found in the care plan section) Acute Rehab OT Goals Patient Stated Goal: to go home  OT Goal Formulation: With patient Time For Goal Achievement: 07/29/14 ADL Goals Pt Will Perform Grooming: with supervision;standing Pt Will Transfer to Toilet: with supervision;ambulating;regular height toilet  Pt Will Perform Toileting - Clothing Manipulation and hygiene: with supervision;sit to/from stand  OT Frequency: Min 2X/week   Barriers to D/C: Decreased caregiver support          Co-evaluation              End of Session Nurse Communication: Mobility status  Activity Tolerance: Patient tolerated treatment well Patient left: in chair   Time: 1208-1223 OT Time Calculation (min): 15 min Charges:  OT General Charges $OT Visit: 1 Procedure OT  Evaluation $Initial OT Evaluation Tier I: 1 Procedure G-Codes:    Betsy Pries 08/06/2014, 12:35 PM

## 2014-07-15 NOTE — Progress Notes (Signed)
CSW notified that Physical Therapy is recommending short term SNF for patient but she is refusing and wants to return home with home health. CSW met with patient who confirmed above. She lives with her husband and feels that she manages well at home.Patient has 2 rolling walkers and a cane; she has not been receiving any home health services. Above information relayed to [facility] for follow-up. CSW will sign off. Patient was appreciative of CSW's visit and states she is looking forward to returning home when stable.    Lorie Phenix. Pauline Good, [place]

## 2014-07-15 NOTE — Progress Notes (Signed)
Pt HR >140s sustaining, VSS, MD made aware and ordered lopressor 12.5mg  q6H. No complaints made at this time. We will continue to monitor.

## 2014-07-15 NOTE — Consult Note (Signed)
Chief Complaint: Chief Complaint  Patient presents with  . Palpitations  . Shortness of Breath  wt loss Liver lesions  Referring Physician(s): Dr Angelena Form  History of Present Illness: Alexa Middleton is a 81 y.o. female   Pt with wt loss Elevated LFTs  Work up reveals liver lesions Was scheduled as OP for liver lesion bx per Dr Wilson Singer Now IP secondary cardiac issues--Aflutter Last dose Eliquis 7/3  Now scheduled for liver lesion bx in IR 7/6 Dr Annamaria Boots had reviewed imaging and approved procedure   Past Medical History  Diagnosis Date  . Cancer     skin cancer to left leg  . Hypertension   . HLD (hyperlipidemia)   . Hyponatremia   . Atrial fibrillation     admitted back in August with AF with RVR - started on amiodarone  . Chronic anticoagulation   . CHF (congestive heart failure) 07/14/2014    nyha class 4    Past Surgical History  Procedure Laterality Date  . Cholecystectomy    . Abdominal hysterectomy    . Hip arthroplasty Left 09/05/2013    Procedure: ARTHROPLASTY BIPOLAR HIP;  Surgeon: Gearlean Alf, MD;  Location: [facility];  Service: Orthopedics;  Laterality: Left;    Allergies: Review of patient's allergies indicates no known allergies.  Medications: Prior to Admission medications   Medication Sig Start Date End Date Taking? Authorizing Provider  apixaban (ELIQUIS) 2.5 MG TABS tablet Take 1 tablet (2.5 mg total) by mouth 2 (two) times daily. 09/27/13  Yes Burtis Junes, NP  atorvastatin (LIPITOR) 10 MG tablet Take 10 mg by mouth daily at 6 PM.   Yes Historical Provider, MD  furosemide (LASIX) 20 MG tablet Take 1 tablet (20 mg total) by mouth daily as needed (prn swelling/weight gain). 10/15/13  Yes Burtis Junes, NP  metoprolol tartrate (LOPRESSOR) 25 MG tablet Take 0.5 tablets (12.5 mg total) by mouth 2 (two) times daily. 09/27/13  Yes Burtis Junes, NP  white petrolatum (VASELINE) GEL Apply 1 application topically daily. To leg   Yes Historical  Provider, MD     Family History  Problem Relation Age of Onset  . CAD Father     died of MI at age 27    History   Social History  . Marital Status: Married    Spouse Name: N/A  . Number of Children: N/A  . Years of Education: N/A   Social History Main Topics  . Smoking status: Former Smoker    Quit date: 09/05/1968  . Smokeless tobacco: Never Used  . Alcohol Use: 4.2 oz/week    7 Glasses of wine per week     Comment: 1  glass wine daily  . Drug Use: No  . Sexual Activity: Not on file   Other Topics Concern  . None   Social History Narrative    Review of Systems: A 12 point ROS discussed and pertinent positives are indicated in the HPI above.  All other systems are negative.  Review of Systems  Constitutional: Positive for activity change, appetite change, fatigue and unexpected weight change.  Respiratory: Positive for shortness of breath. Negative for cough.   Cardiovascular: Negative for chest pain.  Gastrointestinal: Positive for abdominal pain.  Neurological: Positive for weakness.  Psychiatric/Behavioral: Negative for behavioral problems and confusion.    Vital Signs: BP 117/50 mmHg  Pulse 114  Temp(Src) 97.5 F (36.4 C) (Oral)  Resp 20  Ht 5\' 2"  (1.575 m)  Wt 98 lb 9.6 oz (44.725 kg)  BMI 18.03 kg/m2  SpO2 94%  Physical Exam  Constitutional: She is oriented to person, place, and time.  Cardiovascular: Normal rate.   No murmur heard. Pulmonary/Chest: Effort normal and breath sounds normal.  Abdominal: Soft. Bowel sounds are normal. There is tenderness.  Musculoskeletal: Normal range of motion.  Neurological: She is alert and oriented to person, place, and time.  Skin: Skin is warm and dry.  Psychiatric: She has a normal mood and affect. Judgment normal.  Nursing note and vitals reviewed.   Mallampati Score:  MD Evaluation Airway: WNL Heart: WNL Abdomen: WNL Chest/ Lungs: WNL ASA  Classification: 3 Mallampati/Airway Score:  One  Imaging: Dg Chest 2 View  07/19/2014   CLINICAL DATA:  Palpitations, shortness of breath x1 week  EXAM: CHEST - 2 VIEW  COMPARISON:  09/08/2013  FINDINGS: Progressive interstitial airspace opacities in the right upper lobe and left mid lung. Coarse bibasilar interstitial and airspace opacities have slightly increased. Heart size remains normal. Peripheral interstitial thickening most marked the lung bases right greater than left, increased since prior exam. Small bilateral pleural effusions as before. Atheromatous aorta. Mild upper lumbar spondylitic changes noted.  IMPRESSION: 1. New bilateral asymmetric infiltrates or edema. 2. Small pleural effusions as before   Electronically Signed   By: Lucrezia Europe M.D.   On: 08/06/2014 10:09   Ct Abdomen Pelvis W Contrast  07/02/2014   CLINICAL DATA:  Subsequent encounter for weight loss with lesions seen in left hepatic lobe on recent ultrasound exam.  EXAM: CT ABDOMEN AND PELVIS WITH CONTRAST  TECHNIQUE: Multidetector CT imaging of the abdomen and pelvis was performed using the standard protocol following bolus administration of intravenous contrast.  CONTRAST:  11mL ISOVUE-300 IOPAMIDOL (ISOVUE-300) INJECTION 61%  COMPARISON:  Ultrasound exam from 06/24/2014.  FINDINGS: Lower chest: Advanced changes of centrilobular and paraseptal emphysema.  Hepatobiliary: As seen on ultrasound, there are multiple lesions involving the left liver. The dominant heterogeneously enhancing lesion measures 3.3 x 5.5 cm. Other lesions in the left liver range in size from 10 mm up to about 20 mm. This is associated with intrahepatic biliary duct dilatation mainly involving the lateral segment left liver.  Gallbladder is surgically absent. There is no extrahepatic biliary duct dilatation.  Pancreas: There is mild diffuse distention of the main pancreatic duct. Pancreatic head is not well discriminated secondary to distortion of central abdominal anatomy from the left hepatic lobe  enlargement and lack of intra-abdominal fat.  Spleen: No splenomegaly. No focal mass lesion.  Adrenals/Urinary Tract: No adrenal nodule or mass. No enhancing lesion or hydronephrosis in either kidney. No evidence for hydroureter. Urinary bladder is unremarkable.  Stomach/Bowel: Stomach is nondistended. Duodenum is distorted by mass effect from the left hepatic lobe mass, but there is no duodenal obstruction. No small bowel wall thickening. No small bowel dilatation. Terminal ileum is normal. The appendix is not visualized, but there is no edema or inflammation in the region of the cecum. Diverticular changes are noted in the left colon without evidence of diverticulitis.  Vascular/Lymphatic: There is abdominal aortic atherosclerosis without aneurysm.  Necrotic lymphadenopathy is seen in the hepatoduodenal ligament. 1.4 x 2.5 cm lymph node is seen on image 24. 1.4 x 2.3 cm aortocaval lymph node is visible on image 23. There is some abnormal soft tissue attenuation around the celiac axis with some small lymph nodes seen in the celiac axis. 8 mm short axis left para-aortic lymph node is associated. No  evidence for pelvic sidewall lymphadenopathy.  Reproductive: Uterus is surgically absent. No evidence for adnexal mass.  Other: No intraperitoneal free fluid.  Musculoskeletal: Patient is status post left total hip replacement. Bones are diffusely demineralized.  IMPRESSION: Multiple bulky lesions mainly in the lateral segment left liver with some involvement of the medial segment left liver. This is associated with necrotic lymphadenopathy in the hepatoduodenal ligament and retroperitoneal space of the abdomen. Main differential considerations would include a primary hepatic neoplasm ([place]) although no overt sequelae of cirrhosis are evident. Peripheral cholangiocarcinoma would be a consideration. Metastatic disease from nonvisualized primary (such as pancreatic, GI, or breast) would also be a consideration. The left  hepatic lesion should be amenable to percutaneous tissue sampling, as clinically warranted.   Electronically Signed   By: Misty Stanley M.D.   On: 07/02/2014 15:07   US Abdomen Limited  06/24/2014   CLINICAL DATA:  Unexplained weight loss. Elevated alkaline phosphatase. Prior cholecystectomy.  EXAM: US ABDOMEN LIMITED - RIGHT UPPER QUADRANT  COMPARISON:  CT scan of the abdomen dated 03/16/2007  FINDINGS: Gallbladder:  Removed  Common bile duct:  Diameter: 2.6 mm, normal.  Liver:  There are multiple solid poorly defined masses in the left lobe of the liver as well as several complex primarily solid masses in the left lobe with marked dilatation of the bile ducts in the enlarged inferior aspect of the left lobe.  IMPRESSION: Multiple masses in the left lobe of the liver with intrahepatic ductal dilatation. Findings are very worrisome for carcinoma. CT scan of the abdomen and pelvis with IV and oral contrast is recommended for further characterization.   Electronically Signed   By: Lorriane Shire M.D.   On: 06/24/2014 09:58    Labs:  CBC:  Recent Labs  10/15/13 1036 11/15/13 1030 07/30/2014 0825 07/15/14 0504  WBC 10.6* 7.4 7.8 11.0*  HGB 11.9* 11.7* 13.1 13.2  HCT 36.8 36.2 39.2 41.4  PLT 502.0* 372.0 384 354    COAGS:  Recent Labs  09/08/13 1454  INR 1.03  APTT 36    BMP:  Recent Labs  07/12/14 1515 07/13/14 0326 07/14/14 0235 07/15/14 0504  NA 129* 132* 126* 129*  K 3.8 3.8 3.3* 4.3  CL 81* 82* 78* 82*  CO2 39* 41* 42* 40*  GLUCOSE 158* 102* 107* 176*  BUN 13 11 11 12   CALCIUM 7.8* 8.1* 7.5* 7.9*  CREATININE 0.64 0.61 0.44 0.48  GFRNONAA >60 >60 >60 >60  GFRAA >60 >60 >60 >60    LIVER FUNCTION TESTS:  Recent Labs  10/15/13 1036 11/15/13 1030 07/17/2014 0825 07/12/14 0408  BILITOT 0.4 0.3 0.9 0.5  AST 64* 38* 37 32  ALT 100* 36* 21 21  ALKPHOS 246* 161* 178* 189*  PROT 7.1 6.7 5.6* 5.8*  ALBUMIN 3.5 3.0* 3.1* 2.9*    TUMOR MARKERS: No results for  input(s): AFPTM, CEA, CA199, CHROMGRNA in the last 8760 hours.  Assessment and Plan:  Wt loss; elevated liver functions Work up did reveal liver lesions OP liver lesion bx scheduled but now IP Bx for 7/6 in Radiology Risks and Benefits discussed with the patient including, but not limited to bleeding, infection, damage to adjacent structures or low yield requiring additional tests. All of the patient's questions were answered, patient is agreeable to proceed. Consent signed and in chart.   Thank you for this interesting consult.  I greatly enjoyed meeting HERBERTA PICKRON and look forward to participating in their care.  Signed: Monia Sabal  A 07/15/2014, 11:34 AM   I spent a total of 40 Minutes    in face to face in clinical consultation, greater than 50% of which was counseling/coordinating care for liver lesion bx

## 2014-07-15 NOTE — Progress Notes (Signed)
Pt assessed for BiPAP. Alert and oriented, stable on documented settings. BiPAP not indicated at this time.

## 2014-07-15 NOTE — Progress Notes (Signed)
Physical Therapy Treatment Patient Details Name: Alexa Middleton MRN: [id number] DOB: 10/17/1927 Today's Date: 07/15/2014    History of Present Illness Patient is a 81 y/o female presents with chest pain, SOB and A-flutter. PMH includes PAF, RVR, HTN, HL, hyponatremia, asp PNA, on Eliquis.    PT Comments    Pt progressing with gait with RW. Pt HR 109 at rest with Aflutter and rate 116-140 with activity asymptomatic and non-sustained. Pt educated for HEP and ambulation with education provided. Will continue to follow. Dr.Hilty present end of session and aware of rate and activity.   Follow Up Recommendations  SNF;Supervision/Assistance - 24 hour     Equipment Recommendations       Recommendations for Other Services       Precautions / Restrictions Precautions Precautions: Fall    Mobility  Bed Mobility Overal bed mobility: Needs Assistance Bed Mobility: Supine to Sit     Supine to sit: HOB elevated;Min assist     General bed mobility comments: in chair on arrival and end of session  Transfers Overall transfer level: Needs assistance Equipment used: Straight cane;1 person hand held assist;Rolling walker (2 wheeled) Transfers: Sit to/from Stand Sit to Stand: Min assist         General transfer comment: cues for hand placement, safety and increased time to complete  Ambulation/Gait Ambulation/Gait assistance: Min assist Ambulation Distance (Feet): 50 Feet Assistive device: Rolling walker (2 wheeled) Gait Pattern/deviations: Shuffle;Trunk flexed Gait velocity: very slow grossly 62min to complete 50' Gait velocity interpretation: Below normal speed for age/gender General Gait Details: cues for posture and  position in RW   Stairs            Wheelchair Mobility    Modified Rankin (Stroke Patients Only)       Balance Overall balance assessment: Needs assistance   Sitting balance-Leahy Scale: Fair       Standing balance-Leahy Scale: Poor                       Cognition Arousal/Alertness: Awake/alert Behavior During Therapy: Flat affect Overall Cognitive Status: No family/caregiver present to determine baseline cognitive functioning (delayed response to questions and commands) Area of Impairment: Problem solving             Problem Solving: Slow processing      Exercises General Exercises - Lower Extremity Long Arc Quad: AROM;Seated;Both;15 reps Hip Flexion/Marching: AROM;Seated;Both;15 reps    General Comments        Pertinent Vitals/Pain Pain Assessment: No/denies pain    Home Living Family/patient expects to be discharged to:: Private residence Living Arrangements: Spouse/significant other Available Help at Discharge: Family;Available 24 hours/day Type of Home: House Home Access: Stairs to enter Entrance Stairs-Rails: Right Home Layout: One level Home Equipment: Walker - 2 wheels;Cane - single point;Bedside commode      Prior Function Level of Independence: Independent with assistive device(s)      Comments: Pt using SPC PTA. Husband assists with IADLs and drives.    PT Goals (current goals can now be found in the care plan section) Acute Rehab PT Goals Patient Stated Goal: to go home  Progress towards PT goals: Progressing toward goals    Frequency       PT Plan Current plan remains appropriate    Co-evaluation             End of Session Equipment Utilized During Treatment: Oxygen Activity Tolerance: Patient tolerated treatment well Patient left: in chair;with  call bell/phone within reach;with chair alarm set     Time: 1050-1111 PT Time Calculation (min) (ACUTE ONLY): 21 min  Charges:  $Gait Training: 8-22 mins                    G Codes:      Melford Aase 08-05-2014, 1:14 PM Elwyn Reach, [place]

## 2014-07-15 NOTE — Care Management Note (Signed)
Case Management Note  Patient Details  Name: Alexa Middleton MRN: [id number] Date of Birth: 1927/04/21  Subjective/Objective:      Admitted with CHF              Action/Plan: Received call from Pilar Grammes, patient is refusing SNF placement and is requesting to go home at discharge with [facility] services. Patient [facility].[name] with [facility] contacted for arrangements. Attending MD at discharge please order the Disease Management Program for CHF with [facility] services. No DME needed at this time per spouse.  Expected Discharge Date:    07/17/2014              Expected Discharge Plan:  [place]  In-House Referral:  Clinical Social Work  Discharge planning Services  CM Consult  Post Acute Care Choice:    Choice offered to:  Patient    HH Arranged:  Disease Management HH Agency:  [facility]  Status of Service:  In process, will continue to follow  Medicare Important Message Given:  [facility] notification given   Sherrilyn Rist [phone] 07/15/2014, 2:15 PM

## 2014-07-15 NOTE — Progress Notes (Signed)
Pt HR 100s bpm after an HR of administering Lopressor 12.5mg  per order. We will continue to monitor.

## 2014-07-15 NOTE — Progress Notes (Signed)
Patient Name: Alexa Middleton Date of Encounter: 07/15/2014    Principal Problem:   Acute diastolic CHF (congestive heart failure), NYHA class 4 Active Problems:   HTN (hypertension)   PAF (paroxysmal atrial fibrillation)   Hyperlipidemia   Liver Mass   SUBJECTIVE Feeling well this morning. No dyspnea, chest pain, or palpitations. Aflutter since ~ 6p last night - often fast into the 140's - asymptomatic.   CURRENT MEDS . antiseptic oral rinse  7 mL Mouth Rinse BID  . atorvastatin  10 mg Oral q1800  . diltiazem  120 mg Oral Daily  . feeding supplement (ENSURE ENLIVE)  237 mL Oral BID BM  . lactose free nutrition  237 mL Oral TID WC  . metoprolol tartrate  12.5 mg Oral Q6H  . polyethylene glycol  17 g Oral Daily  . white petrolatum  1 application Topical Daily    OBJECTIVE  Filed Vitals:   07/15/14 0312 07/15/14 0406 07/15/14 0441 07/15/14 0749  BP:  110/52    Pulse: 101 142 88   Temp:      TempSrc:      Resp:      Height:      Weight:    98 lb 9.6 oz (44.725 kg)  SpO2:        Intake/Output Summary (Last 24 hours) at 07/15/14 0805 Last data filed at 07/15/14 0749  Gross per 24 hour  Intake   1142 ml  Output   1275 ml  Net   -133 ml   Filed Weights   07/14/14 0518 07/14/14 1104 07/15/14 0749  Weight: 97 lb 9.6 oz (44.271 kg) 99 lb 6.8 oz (45.1 kg) 98 lb 9.6 oz (44.725 kg)    PHYSICAL EXAM  General appearance: alert, cooperative, appears stated age and no distress Neck: no adenopathy, no carotid bruit, no JVD, supple, symmetrical, trachea midline and thyroid not enlarged, symmetric, no tenderness/mass/nodules Lungs: resp reg and unlabored, diminished breath sounds bilat w/ rales LLL. Heart: Irregular rate and rhythm, S1, S2 normal, no murmurs Abdomen: soft, non-tender; bowel sounds normal; no masses, no organomegaly Extremities: edema 1+ pretibial R>L Pulses: 2+ and symmetric Skin: Skin color, texture, turgor normal. No rashes or lesions Neurologic:  Grossly normal  Accessory Clinical Findings  CBC  Recent Labs  07/15/14 0504  WBC 11.0*  HGB 13.2  HCT 41.4  MCV 89.8  PLT 782   Basic Metabolic Panel  Recent Labs  07/14/14 0235 07/15/14 0504  NA 126* 129*  K 3.3* 4.3  CL 78* 82*  CO2 42* 40*  GLUCOSE 107* 176*  BUN 11 12  CREATININE 0.44 0.48  CALCIUM 7.5* 7.9*   TELE  Aflutter - @ time 2:1, otw variable conduction.  Rates 90's-150s overnight. Asymptomatic.   ECG  07/14/14: aflutter 2:1 conduction, 146, poor r prog, lad.  Radiology/Studies  Dg Chest 2 View  07/26/2014   CLINICAL DATA:  Palpitations, shortness of breath x1 week  EXAM: CHEST - 2 VIEW  COMPARISON:  09/08/2013  FINDINGS: Progressive interstitial airspace opacities in the right upper lobe and left mid lung. Coarse bibasilar interstitial and airspace opacities have slightly increased. Heart size remains normal. Peripheral interstitial thickening most marked the lung bases right greater than left, increased since prior exam. Small bilateral pleural effusions as before. Atheromatous aorta. Mild upper lumbar spondylitic changes noted.  IMPRESSION: 1. New bilateral asymmetric infiltrates or edema. 2. Small pleural effusions as before   Electronically Signed   By: Eden Emms.D.  On: 08/01/2014 10:09    ASSESSMENT AND PLAN  1. Paroxysmal atrial fibrillation/flutter:  - Back in aflutter since ~ 1800 last night - variable conduction with rates in 80's to 90's and also prolonged periods of 2:1 conduction in the 140's. - Asymptomatic. - Lopressor 12.5 mg PO q6 hours started overnight for rate control. Already on po Dilt.  Suspect she will require antiarrhythmic therapy, esp as she is pending liver bx and may require additional surgery following that.  She has prev been on amio (summer/fall 2015) and the dose was dropped in 10/2013 2/2 marked LFT elevation.  Amio was d/c'd in Nov 2015 and LFT's normalized (nl this admission).  Thus amio is unlikely to be a good  choice going forward.  Will d/w Dr. Debara Pickett - ? Ep eval. - CHADS -VASC score is 5. On Eliquis for anti-coagulation.  - Holding Eliquis for planned liver biopsy on 07/16/14.  2. Acute on chronic diastolic CHF:  - Good response to diuresis with IV Lasix.  - Weight stable over the last 2 days. - Total 5L negative fluid balance.  - Restart Lasix 20 mg PO daily (was taking prn).  - Normal EF, grade 1 diastolic dysfunction in August 2015. Repeat echo yesterday essentially unchanged from prior. No WMA.   3. Elevated troponin:  - Marginal increase in plateau pattern may be secondary to arrhythmia.  - No further ischemic workup. LV function unchanged.   4. Elevated d-dimer with asymmetrical right lower extremity edema:  - No DVT by [place] - chronic eliquis in setting of af/flutter.  5. Liver mass:  - Plans for US guided biopsy this week on Wednesday (7/6).  - Holding Eliquis.  - IR aware - inpt order placed.  6. Acute on chronic hypercapnic respiratory failure: - Resolved; suspect underlying chronic respiratory acidosis and compensatory metabolic alkalosis; remote history of 30-pack year smoking, exacerbation due to CHF.   7. Hyponatremia: - Stable. - Restart Lasix 20 mg PO daily.  8. Hypokalemia: - Resolved. K+ 4.3.   9. Deconditioning: - Very weak. Recent significant weight loss.  - Possibly related to malignancy.  - Husband does not believe he can take care of her at home.  - May require SNF placement.  - PT/OT/Case mgmt following.  Signed, Murray Hodgkins NP 07/15/2014, 9:45 AM

## 2014-07-16 ENCOUNTER — Inpatient Hospital Stay (HOSPITAL_COMMUNITY): Payer: Medicare Other

## 2014-07-16 ENCOUNTER — Ambulatory Visit (HOSPITAL_COMMUNITY): Admission: RE | Admit: 2014-07-16 | Payer: Medicare Other | Source: Ambulatory Visit

## 2014-07-16 LAB — BASIC METABOLIC PANEL
Anion gap: 4 — ABNORMAL LOW (ref 5–15)
BUN: 13 mg/dL (ref 6–20)
CHLORIDE: 81 mmol/L — AB (ref 101–111)
CO2: 43 mmol/L — ABNORMAL HIGH (ref 22–32)
CREATININE: 0.45 mg/dL (ref 0.44–1.00)
Calcium: 8.2 mg/dL — ABNORMAL LOW (ref 8.9–10.3)
GFR calc Af Amer: >60 mL/min (ref >60)
GFR calc non Af Amer: >60 mL/min (ref >60)
GLUCOSE: 128 mg/dL — AB (ref 65–99)
Potassium: 5.2 mmol/L — ABNORMAL HIGH (ref 3.5–5.1)
Sodium: 128 mmol/L — ABNORMAL LOW (ref 135–145)

## 2014-07-16 LAB — CBC
HCT: 42.8 % (ref 36.0–46.0)
Hemoglobin: 13.6 g/dL (ref 12.0–15.0)
MCH: 28.9 pg (ref 26.0–34.0)
MCHC: 31.8 g/dL (ref 30.0–36.0)
MCV: 90.9 fL (ref 78.0–100.0)
Platelets: 376 10*3/uL (ref 150–400)
RBC: 4.71 MIL/uL (ref 3.87–5.11)
RDW: 14.1 % (ref 11.5–15.5)
WBC: 12.8 10*3/uL — AB (ref 4.0–10.5)

## 2014-07-16 LAB — PROTIME-INR
INR: 1.05 (ref 0.00–1.49)
Prothrombin Time: 13.9 seconds (ref 11.6–15.2)

## 2014-07-16 LAB — APTT: aPTT: 32 seconds (ref 24–37)

## 2014-07-16 MED ORDER — MIDAZOLAM HCL 2 MG/2ML IJ SOLN
INTRAMUSCULAR | Status: AC
  Filled 2014-07-16: qty 2

## 2014-07-16 MED ORDER — LIDOCAINE HCL (PF) 1 % IJ SOLN
INTRAMUSCULAR | Status: AC
  Filled 2014-07-16: qty 10

## 2014-07-16 MED ORDER — GELATIN ABSORBABLE 12-7 MM EX MISC
CUTANEOUS | Status: AC
  Filled 2014-07-16: qty 1

## 2014-07-16 MED ORDER — MIDAZOLAM HCL 2 MG/2ML IJ SOLN
INTRAMUSCULAR | Status: AC | PRN
  Administered 2014-07-16: 0.5 mg via INTRAVENOUS

## 2014-07-16 MED ORDER — FENTANYL CITRATE (PF) 100 MCG/2ML IJ SOLN
INTRAMUSCULAR | Status: AC
  Filled 2014-07-16: qty 2

## 2014-07-16 MED ORDER — FENTANYL CITRATE (PF) 100 MCG/2ML IJ SOLN
INTRAMUSCULAR | Status: AC | PRN
  Administered 2014-07-16: 12.5 ug via INTRAVENOUS

## 2014-07-16 NOTE — Progress Notes (Signed)
SUBJECTIVE: The patient is stable today.  She is complaining of being hot.  Her shortness of breath is stable. No chest pain.   CURRENT MEDICATIONS: . antiseptic oral rinse  7 mL Mouth Rinse BID  . atorvastatin  10 mg Oral q1800  . diltiazem  240 mg Oral Daily  . feeding supplement (ENSURE ENLIVE)  237 mL Oral BID BM  . furosemide  20 mg Oral Daily  . lactose free nutrition  237 mL Oral TID WC  . metoprolol tartrate  12.5 mg Oral Q6H  . polyethylene glycol  17 g Oral Daily  . sotalol  80 mg Oral Q12H  . white petrolatum  1 application Topical Daily   . sodium chloride 10 mL/hr at 07/16/14 0530    OBJECTIVE: Physical Exam: Filed Vitals:   07/16/14 0209 07/16/14 0529 07/16/14 0533 07/16/14 0537  BP: 112/51 104/68    Pulse: 85 104    Temp: 98.4 F (36.9 C) 97.8 F (36.6 C)    TempSrc: Oral Oral    Resp: 16 17    Height:      Weight:  92 lb 4.8 oz (41.867 kg)  93 lb 6.4 oz (42.366 kg)  SpO2: 100% 98% 98%     Intake/Output Summary (Last 24 hours) at 07/16/14 8891 Last data filed at 07/16/14 0600  Gross per 24 hour  Intake    840 ml  Output    300 ml  Net    540 ml    Telemetry reveals atrial fibrillation/flutter, ventricular rate 80-100  GEN- The patient is thin and ill appearing, alert and oriented x 3 today.   Head- normocephalic, atraumatic Eyes-  Sclera clear, conjunctiva pink Ears- hearing intact Oropharynx- clear Neck- supple, no JVP Lymph- no cervical lymphadenopathy Lungs- Clear to ausculation bilaterally, normal work of breathing Heart- Irregular rate and rhythm, no murmurs, rubs or gallops  GI- soft, NT, ND, + BS Extremities- no clubbing, cyanosis, or edema Skin- no rash or lesion Psych- euthymic mood, full affect Neuro- strength and sensation are intact  LABS: Basic Metabolic Panel:  Recent Labs  07/15/14 0504 07/16/14 0318  NA 129* 128*  K 4.3 5.2*  CL 82* 81*  CO2 40* 43*  GLUCOSE 176* 128*  BUN 12 13  CREATININE 0.48 0.45    CALCIUM 7.9* 8.2*   CBC:  Recent Labs  07/15/14 0504 07/16/14 0318  WBC 11.0* 12.8*  HGB 13.2 13.6  HCT 41.4 42.8  MCV 89.8 90.9  PLT 354 376    RADIOLOGY: Dg Chest 2 View 07/22/2014   CLINICAL DATA:  Palpitations, shortness of breath x1 week  EXAM: CHEST - 2 VIEW  COMPARISON:  09/08/2013  FINDINGS: Progressive interstitial airspace opacities in the right upper lobe and left mid lung. Coarse bibasilar interstitial and airspace opacities have slightly increased. Heart size remains normal. Peripheral interstitial thickening most marked the lung bases right greater than left, increased since prior exam. Small bilateral pleural effusions as before. Atheromatous aorta. Mild upper lumbar spondylitic changes noted.  IMPRESSION: 1. New bilateral asymmetric infiltrates or edema. 2. Small pleural effusions as before   Electronically Signed   By: Lucrezia Europe M.D.   On: 07/21/2014 10:09   ASSESSMENT AND PLAN:  Principal Problem:   Acute diastolic CHF (congestive heart failure), NYHA class 4 Active Problems:   HTN (hypertension)   Hyperlipidemia   PAF (paroxysmal atrial fibrillation)   Liver mass  1. Atrial arrhythmias The patient has persistent atrial fibrillation and atrial  flutter and has failed medical therapy with amiodarone (elevated LFT's).  Rates improved with sotalol/metoprolol/diltiazem - continue current therapy With PA pressure of 74, likelihood of recurrent atrial arrhythmias is high.  With co-morbidities, she is not currently a candidate for ablation.  Resume anticoagulation for CHADS2VASC of at least 4 when able after liver biopsy  2. Acute on chronic diastolic heart failure Improved with diuresis  3. Liver mass Pending biopsy later this morning Eliquis on hold   Chanetta Marshall, NP 07/16/2014 8:10 AM   I have seen, examined the patient, and reviewed the above assessment and plan.  On exam, iRRR.  Telemetry reveals that heart rats are better.  Changes to above are made  where necessary.   Proceed with liver biopsy today.  Will need to restart anticoagulation when able.  Co Sign: Thompson Grayer, MD 07/16/2014 8:36 AM

## 2014-07-16 NOTE — Sedation Documentation (Signed)
Bandaid to R abdomen intact

## 2014-07-16 NOTE — Progress Notes (Signed)
Spoke with Dr. Aundra Dubin, Okay to give lopressor PO this AM. HR in aflutter irregular 80-115. Will continue to monitor. Ronnette Hila, RN

## 2014-07-16 NOTE — Progress Notes (Signed)
OT Cancellation Note  Patient Details Name: Alexa Middleton MRN: [id number] DOB: [birth year]   Cancelled Treatment:    Reason Eval/Treat Not Completed: Patient at procedure or test/ unavailable  [place], Thereasa Parkin 07/16/2014, 10:15 AM

## 2014-07-16 NOTE — Clinical Documentation Improvement (Addendum)
07/14/14: Nutrition eval noted.Marland KitchenMarland Kitchen"Severe malnutrition in context of chronic illness Underweight...INTERVENTION: Ensure Enlive (each supplement provides 350kcal and 20 grams of protein)...NUTRITION DIAGNOSIS: Malnutrition related to chronic illness as evidenced by severe depletion of body fat, severe depletion of muscle mass, 19% percent weight loss x 7 months, energy intake < 75% for > or equal to 1 month."...  For accurate Dx specificity & severity can noted nutrition diagnosis be further validated for cond being mon'd, eval'd & tx'd per nutrition documentation. Thank you  Possible Clinical Conditions? Severe Malnutrition   Protein Calorie Malnutrition Severe Protein Calorie Malnutrition Emaciation  Cachexia  Other Condition Cannot clinically determine  Supporting Information: See above note & full Nutrition note 07/14/14  Thank You, Ermelinda Das, RN, BSN, CCDS Certified Clinical Documentation Specialist Pager: [phone] [place]: Health Information Management  Agree with severe, protein calorie malnutrition. Added to the chart.  Pixie Casino, MD, [facility] Attending Cardiologist [place]

## 2014-07-16 NOTE — Procedures (Signed)
Successful left hepatic mass 18 g core bxs No comp Stable Path pending Full report in pacs EBL 0

## 2014-07-17 DIAGNOSIS — E875 Hyperkalemia: Secondary | ICD-10-CM

## 2014-07-17 LAB — BASIC METABOLIC PANEL
ANION GAP: 9 (ref 5–15)
ANION GAP: 8 (ref 5–15)
BUN: 28 mg/dL — ABNORMAL HIGH (ref 6–20)
BUN: 28 mg/dL — ABNORMAL HIGH (ref 6–20)
CALCIUM: 8.3 mg/dL — AB (ref 8.9–10.3)
CHLORIDE: 79 mmol/L — AB (ref 101–111)
CO2: 38 mmol/L — AB (ref 22–32)
CO2: 38 mmol/L — AB (ref 22–32)
CREATININE: 0.58 mg/dL (ref 0.44–1.00)
Calcium: 8.3 mg/dL — ABNORMAL LOW (ref 8.9–10.3)
Chloride: 79 mmol/L — ABNORMAL LOW (ref 101–111)
Creatinine, Ser: 0.51 mg/dL (ref 0.44–1.00)
GFR calc Af Amer: >60 mL/min (ref >60)
GFR calc Af Amer: >60 mL/min (ref >60)
GFR calc non Af Amer: >60 mL/min (ref >60)
GFR calc non Af Amer: >60 mL/min (ref >60)
GLUCOSE: 185 mg/dL — AB (ref 65–99)
Glucose, Bld: 131 mg/dL — ABNORMAL HIGH (ref 65–99)
POTASSIUM: 5.7 mmol/L — AB (ref 3.5–5.1)
POTASSIUM: 5.5 mmol/L — AB (ref 3.5–5.1)
SODIUM: 126 mmol/L — AB (ref 135–145)
Sodium: 125 mmol/L — ABNORMAL LOW (ref 135–145)

## 2014-07-17 MED ORDER — BOOST PLUS PO LIQD
237.0000 mL | Freq: Three times a day (TID) | ORAL | Status: DC
Start: 2014-07-18 — End: 2014-07-18
  Filled 2014-07-17 (×6): qty 237

## 2014-07-17 MED ORDER — SODIUM POLYSTYRENE SULFONATE 15 GM/60ML PO SUSP
15.0000 g | Freq: Once | ORAL | Status: AC
  Administered 2014-07-17: 15 g via ORAL
  Filled 2014-07-17: qty 60

## 2014-07-17 MED ORDER — APIXABAN 2.5 MG PO TABS
2.5000 mg | ORAL_TABLET | Freq: Two times a day (BID) | ORAL | Status: DC
  Administered 2014-07-17: 2.5 mg via ORAL
  Filled 2014-07-17 (×3): qty 1

## 2014-07-17 NOTE — Progress Notes (Signed)
Nutrition Follow-up  DOCUMENTATION CODES:  Severe malnutrition in context of chronic illness, Underweight  INTERVENTION:  Ensure Enlive (each supplement provides 350kcal and 20 grams of protein), Snacks  NUTRITION DIAGNOSIS:  Malnutrition related to chronic illness as evidenced by severe depletion of body fat, severe depletion of muscle mass, percent weight loss, energy intake < 75% for > or equal to 1 month.  Ongoing  GOAL:  Patient will meet greater than or equal to 90% of their needs  Unmet  MONITOR:  PO intake, Supplement acceptance, Weight trends, Labs  REASON FOR ASSESSMENT:  Malnutrition Screening Tool    ASSESSMENT: Pt with hx of PAF, RVR, HTN, hyponatremia, aspiration PNA who was admitted with chest pain, SOB. Pt has hx of abnormal LFTs with recent US and CT (liver mass) with plans for needle bx liver.   Per nursing notes, pt continues to eat about 25% of meals; declined lunch today. Pt states she is drinking some of the nutritional supplements, prefers Boost Plus. RD encouraged PO intake and continued intake of nutritional supplements. Pt feels she can drink 3 nutritional supplements per day and is agreeable to receiving snacks twice daily. Pt's weight is down 17 lbs from 4 days ago- ? Accuracy. -4.4 L fluid balance.   Labs: low sodium, elevated potassium, low chloride, low calcium  Height:  Ht Readings from Last 1 Encounters:  07/14/14 5\' 2"  (1.575 m)    Weight:  Wt Readings from Last 1 Encounters:  07/17/14 82 lb 4.8 oz (37.331 kg)   07/14/14 99 lb 6.8 oz (45.1 kg)        Ideal Body Weight:  50 kg  Wt Readings from Last 10 Encounters:  07/17/14 82 lb 4.8 oz (37.331 kg)  01/08/14 109 lb (49.442 kg)  10/15/13 113 lb 12.8 oz (51.619 kg)  09/27/13 115 lb 6.4 oz (52.345 kg)  09/26/13 115 lb (52.164 kg)  09/18/13 123 lb 6.4 oz (55.974 kg)  09/10/13 131 lb 6.3 oz (59.6 kg)    BMI:  Body mass index is 15.05 kg/(m^2). (Underweight)  Estimated  Nutritional Needs:  Kcal:  1400-1600  Protein:  70-80 grams  Fluid:  > 1.5 L/day  Skin:  Reviewed, no issues  Diet Order:  Diet Heart Room service appropriate?: Yes; Fluid consistency:: Thin  EDUCATION NEEDS:  No education needs identified at this time   Intake/Output Summary (Last 24 hours) at 07/17/14 1511 Last data filed at 07/17/14 1404  Gross per 24 hour  Intake    360 ml  Output      0 ml  Net    360 ml    Last BM:  7/7  Pryor Ochoa RD, LDN Inpatient Clinical Dietitian Pager: [phone] After Hours Pager: [phone]

## 2014-07-17 NOTE — Progress Notes (Signed)
P OOB to chair at bed side , tolerated well . I/s given  . Will continue to  monitor

## 2014-07-17 NOTE — Progress Notes (Signed)
Notified by CMT that patient has been in sinus rhythm for a while and was able to show strip of when patient converted to sinus rhythm in CHL. Patient complains of no discomfort or pain. Reported to day nurse at shift change. Nurse signing off at this time.

## 2014-07-17 NOTE — Progress Notes (Signed)
Occupational Therapy Treatment Patient Details Name: Alexa Middleton MRN: [id number] DOB: 08/11/1927 Today's Date: 07/17/2014    History of present illness Patient is a 81 y/o female presents with chest pain, SOB and A-flutter. PMH includes PAF, RVR, HTN, HL, hyponatremia, asp PNA, on Eliquis.   OT comments  Pt with K at 5.7 this morning, however pt asking to use BSC and attempting to get OOB as OT walking past her room. OT assisted pt to [facility] from bed, assisted with toileting and transfer back to bed  Follow Up Recommendations  SNF    Equipment Recommendations  None recommended by OT    Recommendations for Other Services      Precautions / Restrictions Precautions Precautions: Fall Restrictions Weight Bearing Restrictions: No       Mobility Bed Mobility Overal bed mobility: Needs Assistance Bed Mobility: Supine to Sit;Sit to Supine     Supine to sit: HOB elevated;Min assist Sit to supine: Min assist      Transfers Overall transfer level: Needs assistance Equipment used: 1 person hand held assist;Rolling walker (2 wheeled) Transfers: Sit to/from Stand   Stand pivot transfers: Min assist       General transfer comment: cues for hand placement, safety and increased time to complete    Balance           Standing balance support: During functional activity Standing balance-Leahy Scale: Poor                     ADL       Grooming: Set up;Sitting;Wash/dry hands                   Toilet Transfer: BSC;RW;Minimal assistance   Toileting- Clothing Manipulation and Hygiene: Moderate assistance;Sit to/from stand                Vision  no change from baseline                              Cognition   Behavior During Therapy: Flat affect Overall Cognitive Status: No family/caregiver present to determine baseline cognitive functioning Area of Impairment: Problem solving              Problem Solving: Slow  processing;Difficulty sequencing;Requires verbal cues;Requires tactile cues      Extremity/Trunk Assessment   generalized weakness                        General Comments  pt pleasant and cooperative    Pertinent Vitals/ Pain       Pain Assessment: No/denies pain                                                          Frequency Min 2X/week     Progress Toward Goals  OT Goals(current goals can now be found in the care plan section)  Progress towards OT goals: Progressing toward goals     Plan Discharge plan remains appropriate                     End of Session Equipment Utilized During Treatment: Gait belt;Rolling walker;Other (comment) (BSC)   Activity Tolerance Patient tolerated treatment well   Patient Left  in bed;with call bell/phone within reach             Time: 0947-1004 OT Time Calculation (min): 17 min  Charges: OT General Charges $OT Visit: 1 Procedure OT Treatments $Self Care/Home Management : 8-22 mins  Britt Bottom 07/17/2014, 1:51 PM

## 2014-07-17 NOTE — Progress Notes (Addendum)
SUBJECTIVE: The patient is stable today.  Denies CP or SOB.  No real clinical change with sinus rhythm.  CURRENT MEDICATIONS: . antiseptic oral rinse  7 mL Mouth Rinse BID  . atorvastatin  10 mg Oral q1800  . diltiazem  240 mg Oral Daily  . feeding supplement (ENSURE ENLIVE)  237 mL Oral BID BM  . furosemide  20 mg Oral Daily  . lactose free nutrition  237 mL Oral TID WC  . polyethylene glycol  17 g Oral Daily  . sotalol  80 mg Oral Q12H  . white petrolatum  1 application Topical Daily   . sodium chloride 10 mL/hr at 07/16/14 0530    OBJECTIVE: Physical Exam: Filed Vitals:   07/16/14 1327 07/16/14 2112 07/16/14 2320 07/17/14 0650  BP:  110/41 124/43 112/45  Pulse: 115 64 61 56  Temp: 97.9 F (36.6 C) 97.5 F (36.4 C)  97.5 F (36.4 C)  TempSrc: Axillary Oral  Oral  Resp:  20  16  Height:      Weight:    37.331 kg (82 lb 4.8 oz)  SpO2: 95% 92%  97%    Intake/Output Summary (Last 24 hours) at 07/17/14 0836 Last data filed at 07/16/14 1825  Gross per 24 hour  Intake    220 ml  Output      0 ml  Net    220 ml    Telemetry reveals sinus rhythm, qtc 442  GEN- The patient is thin and ill appearing, alert and oriented x 3 today.   Head- normocephalic, atraumatic Eyes-  Sclera clear, conjunctiva pink Ears- hearing intact Oropharynx- clear Neck- supple, no JVP Lymph- no cervical lymphadenopathy Lungs- Clear to ausculation bilaterally, normal work of breathing Heart- regular rate and rhythm, no murmurs, rubs or gallops  GI- soft, NT, ND, + BS Extremities- no clubbing, cyanosis, or edema Skin- no rash or lesion Psych- euthymic mood, full affect Neuro- strength and sensation are intact  LABS: Basic Metabolic Panel:  Recent Labs  07/16/14 0318 07/17/14 0354  NA 128* 126*  K 5.2* 5.7*  CL 81* 79*  CO2 43* 38*  GLUCOSE 128* 131*  BUN 13 28*  CREATININE 0.45 0.58  CALCIUM 8.2* 8.3*   CBC:  Recent Labs  07/15/14 0504 07/16/14 0318  WBC 11.0* 12.8*    HGB 13.2 13.6  HCT 41.4 42.8  MCV 89.8 90.9  PLT 354 376    RADIOLOGY: Dg Chest 2 View 07/19/2014   CLINICAL DATA:  Palpitations, shortness of breath x1 week  EXAM: CHEST - 2 VIEW  COMPARISON:  09/08/2013  FINDINGS: Progressive interstitial airspace opacities in the right upper lobe and left mid lung. Coarse bibasilar interstitial and airspace opacities have slightly increased. Heart size remains normal. Peripheral interstitial thickening most marked the lung bases right greater than left, increased since prior exam. Small bilateral pleural effusions as before. Atheromatous aorta. Mild upper lumbar spondylitic changes noted.  IMPRESSION: 1. New bilateral asymmetric infiltrates or edema. 2. Small pleural effusions as before   Electronically Signed   By: Lucrezia Europe M.D.   On: 08/10/2014 10:09   ASSESSMENT AND PLAN:  Principal Problem:   Acute diastolic CHF (congestive heart failure), NYHA class 4 Active Problems:   HTN (hypertension)   Hyperlipidemia   PAF (paroxysmal atrial fibrillation)   Liver mass  1. Atrial arrhythmias Now in sinus with sotalol Will stop metoprolol We need to restart anticoagulation as soon as we can post liver biopsy  2.  Acute on chronic diastolic heart failure Improved  3. Liver mass Biopsy results pending Resume anticoagulation if ok with IR  4. Hyperkalemia Will need to treat  As arrhythmia has improved, will return to general cardiology for management. Anticipate discharge soon (once K is stable)  Thompson Grayer, MD 07/17/2014 8:36 AM

## 2014-07-17 NOTE — Plan of Care (Signed)
Problem: Phase III Progression Outcomes Goal: Voiding independently Outcome: Not Applicable Date Met:  79/81/02 Pt is incontinent of bowel and bladder

## 2014-07-17 NOTE — Progress Notes (Signed)
PT Cancellation Note  Patient Details Name: Alexa Middleton MRN: [id number] DOB: 07-15-1927   Cancelled Treatment:    Reason Eval/Treat Not Completed: Medical issues which prohibited therapy (currently with K 5.7 and not medically appropriate)   [name] 07/17/2014, 10:56 AM Elwyn Reach, [place]

## 2014-07-18 ENCOUNTER — Inpatient Hospital Stay (HOSPITAL_COMMUNITY): Payer: Medicare Other

## 2014-07-18 DIAGNOSIS — E871 Hypo-osmolality and hyponatremia: Secondary | ICD-10-CM

## 2014-07-18 DIAGNOSIS — R404 Transient alteration of awareness: Secondary | ICD-10-CM

## 2014-07-18 LAB — CBC
HCT: 47.2 % — ABNORMAL HIGH (ref 36.0–46.0)
HEMOGLOBIN: 14.8 g/dL (ref 12.0–15.0)
MCH: 29.5 pg (ref 26.0–34.0)
MCHC: 31.4 g/dL (ref 30.0–36.0)
MCV: 94.2 fL (ref 78.0–100.0)
Platelets: 403 10*3/uL — ABNORMAL HIGH (ref 150–400)
RBC: 5.01 MIL/uL (ref 3.87–5.11)
RDW: 14 % (ref 11.5–15.5)
WBC: 15.1 10*3/uL — AB (ref 4.0–10.5)

## 2014-07-18 LAB — COMPREHENSIVE METABOLIC PANEL
ALK PHOS: 200 U/L — AB (ref 38–126)
ALT: 29 U/L (ref 14–54)
AST: 34 U/L (ref 15–41)
Albumin: 2.7 g/dL — ABNORMAL LOW (ref 3.5–5.0)
Anion gap: 7 (ref 5–15)
BUN: 25 mg/dL — ABNORMAL HIGH (ref 6–20)
CO2: 38 mmol/L — AB (ref 22–32)
Calcium: 8.2 mg/dL — ABNORMAL LOW (ref 8.9–10.3)
Chloride: 83 mmol/L — ABNORMAL LOW (ref 101–111)
Creatinine, Ser: 0.48 mg/dL (ref 0.44–1.00)
GLUCOSE: 167 mg/dL — AB (ref 65–99)
Potassium: 5.8 mmol/L — ABNORMAL HIGH (ref 3.5–5.1)
Sodium: 128 mmol/L — ABNORMAL LOW (ref 135–145)
Total Bilirubin: 0.5 mg/dL (ref 0.3–1.2)
Total Protein: 5.7 g/dL — ABNORMAL LOW (ref 6.5–8.1)

## 2014-07-18 LAB — BASIC METABOLIC PANEL
Anion gap: 4 — ABNORMAL LOW (ref 5–15)
BUN: 23 mg/dL — ABNORMAL HIGH (ref 6–20)
CO2: 43 mmol/L — ABNORMAL HIGH (ref 22–32)
Calcium: 8.2 mg/dL — ABNORMAL LOW (ref 8.9–10.3)
Chloride: 77 mmol/L — ABNORMAL LOW (ref 101–111)
Creatinine, Ser: 0.44 mg/dL (ref 0.44–1.00)
GFR calc Af Amer: >60 mL/min (ref >60)
GFR calc non Af Amer: >60 mL/min (ref >60)
Glucose, Bld: 133 mg/dL — ABNORMAL HIGH (ref 65–99)
Potassium: 5.7 mmol/L — ABNORMAL HIGH (ref 3.5–5.1)
Sodium: 124 mmol/L — ABNORMAL LOW (ref 135–145)

## 2014-07-18 LAB — AMMONIA: Ammonia: 79 umol/L — ABNORMAL HIGH (ref 9–35)

## 2014-07-18 LAB — MAGNESIUM: Magnesium: 1.9 mg/dL (ref 1.7–2.4)

## 2014-07-18 LAB — GLUCOSE, CAPILLARY: GLUCOSE-CAPILLARY: 155 mg/dL — AB (ref 65–99)

## 2014-07-18 MED ORDER — MORPHINE BOLUS VIA INFUSION
1.0000 mg | INTRAVENOUS | Status: DC | PRN
  Filled 2014-07-18: qty 1

## 2014-07-18 MED ORDER — SODIUM CHLORIDE 0.9 % IV SOLN
INTRAVENOUS | Status: DC
  Administered 2014-07-18: 09:00:00 via INTRAVENOUS

## 2014-07-18 MED ORDER — FUROSEMIDE 10 MG/ML IJ SOLN
20.0000 mg | Freq: Once | INTRAMUSCULAR | Status: AC
  Administered 2014-07-18: 20 mg via INTRAVENOUS
  Filled 2014-07-18: qty 2

## 2014-07-18 MED ORDER — SODIUM CHLORIDE 0.9 % IV BOLUS (SEPSIS)
1000.0000 mL | Freq: Once | INTRAVENOUS | Status: AC
  Administered 2014-07-18: 1000 mL via INTRAVENOUS

## 2014-07-18 MED ORDER — GLYCOPYRROLATE 0.2 MG/ML IJ SOLN
0.1000 mg | INTRAMUSCULAR | Status: DC | PRN
  Filled 2014-07-18: qty 0.5

## 2014-07-18 MED ORDER — HALOPERIDOL LACTATE 5 MG/ML IJ SOLN: 1.0000 mg | Freq: Four times a day (QID) | INTRAMUSCULAR | Status: DC | PRN

## 2014-07-18 MED ORDER — SODIUM CHLORIDE 0.9 % IV SOLN
1.0000 mg/h | INTRAVENOUS | Status: DC
  Administered 2014-07-18: 1 mg/h via INTRAVENOUS
  Filled 2014-07-18: qty 10

## 2014-07-21 ENCOUNTER — Telehealth: Payer: Self-pay | Admitting: Internal Medicine

## 2014-07-21 NOTE — Telephone Encounter (Signed)
Original d/c received on this patient after speaking with Jeani Hawking at [facility] I have sent interoffice for Dr.K Hilty to sign. [employer] was contacted spoke with Altha Harm she was made aware to generate a new d/c and send over to [facility] office the one here  Has Ignacia Bayley name on it she stated just to white out his name I made her aware we do not use white out.

## 2014-07-22 ENCOUNTER — Telehealth: Payer: Self-pay | Admitting: Internal Medicine

## 2014-07-22 NOTE — Telephone Encounter (Signed)
08-16-14 Received death certificate from [place] for Dr. Debara Pickett to fill out, I took it back to [place] to get Dr. Debara Pickett to sign it.  cbr  Aug 16, 2014 I received signed death certificate back from [place], I made copy for our records then called funeral home to pick up.  cbr

## 2014-07-22 NOTE — Discharge Summary (Signed)
Discharge Summary / Death Note   Alexa Middleton MRN: [id number] DOB: Nov 05, 1927  Admit Date: 07/31/2014 Time of Death:  2014/08/07 Date of Death: 18:57  Attending Physician: No att. providers found PCP: Alexa Bolt, MD  Cause of Death: Hypoxic respiratory failure  Hospital Diagnoses: Principal Problem:   Acute diastolic CHF (congestive heart failure), NYHA class 4 Active Problems:   HTN (hypertension)   Hyperlipidemia   PAF (paroxysmal atrial fibrillation)   Liver mass   Liver Cancer  Procedures:  2D Echo Study Conclusions  - Left ventricle: The cavity size was normal. Wall thickness was increased in a pattern of mild LVH. The estimated ejection fraction was 65%. Wall motion was normal; there were no regional wall motion abnormalities. Findings consistent with left ventricular diastolic dysfunction. - Aortic valve: Sclerosis without stenosis. There was no significant regurgitation. - Right ventricle: The cavity size was normal. Systolic function was mildly reduced. - Pulmonary arteries: PA peak pressure: 74 mm Hg (S).  LE Venous Doppler Summary:  - No evidence of deep vein or superficial thrombosis involving the right lower extremity and left common femoral vein. Interstitial fluid noted in the right calf. - No evidence of Baker&'s cyst on the right.  CLINICAL DATA: Found unresponsive.  EXAM: CT HEAD WITHOUT CONTRAST  TECHNIQUE: Contiguous axial images were obtained from the base of the skull through the vertex without intravenous contrast.  COMPARISON: 01/15/2015  FINDINGS: There is mild low attenuation throughout the subcortical and periventricular white matter consistent with chronic microvascular disease. There is prominence of the sulci and ventricles consistent with brain atrophy. No acute intracranial hemorrhage, mass or evidence of acute cortical infarct. The paranasal sinuses are clear. The mastoid air cells are  clear. The calvarium is intact.  IMPRESSION: 1. No acute intracranial abnormalities. 2. Chronic microvascular disease and brain atrophy.   Electronically Signed  By: Alexa Middleton M.D.  On: 08/07/14 09:04  CLINICAL DATA: Multiple left hepatic lesions concerning for metastatic disease versus multifocal hepatocellular carcinoma. No known primary.  EXAM: ULTRASOUND GUIDED CORE BIOPSY OF LEFT HEPATIC MASS  MEDICATIONS: 0.5 mg IV Versed; 712.5 mcg IV Fentanyl  Total Moderate Sedation Time: 15  PROCEDURE: The procedure, risks, benefits, and alternatives were explained to the patient. Questions regarding the procedure were encouraged and answered. The patient understands and consents to the procedure.  The subxiphoid region was prepped with ChloraPrep in a sterile fashion, and a sterile drape was applied covering the operative field. A sterile gown and sterile gloves were used for the procedure. Local anesthesia was provided with 1% Lidocaine.  Previous imaging reviewed. Preliminary ultrasound performed. Numerous left hepatic solid hypoechoic masses were demonstrated through a subxiphoid window. Under sterile conditions and local anesthesia, a 17 gauge 6.8 cm access needle was advanced percutaneously under direct ultrasound into a hypoechoic mass in the left hepatic lobe lateral segment. Needle position confirmed with CT. Three core biopsies obtained through the access under direct ultrasound. Needle removed. Samples placed in formalin. Samples were intact and non fragmented. Needle tract embolized with Gel-Foam pledgets.  COMPLICATIONS: None immediate  FINDINGS: Imaging confirms needle placement in a left hepatic mass for core biopsy  IMPRESSION: Successful ultrasound left hepatic mass 18 gauge core biopsies   Electronically Signed  By: Alexa Middleton. Alexa Middleton M.D.  On: 07/16/2014 12:26  Hostpital Course: Alexa Middleton is a pleasant 81 year old female who  I assumed care of as part of the inpatient service. She's been followed by Dr. Martin[place]. She was admitted for possible acute  decompensated diastolic heart failure. She improved initially with some diuresis however was noted to have atrial fibrillation. This is new onset and EP was consulted. They recommended starting her on sotalol. She successfully converted back to sinus and seemed to be doing fairly well although had decreased appetite and some failure to thrive. Subsequently she became less responsive during the hospitalization. Eventually a rapid response was called due to nonresponsiveness and agonal breathing. A stat CT was called for and this demonstrated no acute stroke or mass lesions. During her hospitalization she also underwent a liver biopsy which was positive for cancer. It is unknown what the extent of her cancer was. It was not felt that she had bleeding or complications related to her liver biopsy. She was noted to be hyponatremic and her ammonia levels were elevated. After discussion with her husband he did not want aggressive care and felt that she thought that death was imminent. We did do a short period of hydration which was not effective and ultimately recommended a comfort care approach. The palliative care service was consulted and greatly assisted in management. She agreed with our current management plan as per the patient's wishes. There was nursing notification and confirmation of death by Drs. Alexa Middleton and Alexa Bayley, NP, on 08-11-14 at 18:57. An autopsy was declined.  Alexa Casino, MD, [facility] Attending Cardiologist [place]

## 2014-07-25 DIAGNOSIS — E43 Unspecified severe protein-calorie malnutrition: Secondary | ICD-10-CM | POA: Diagnosis present

## 2014-08-11 NOTE — Progress Notes (Addendum)
Palliative consult received. I have reviewed the current orders and noted a desire for comfort care and spoken with Dr. Debara Pickett. Will add on palliative PRNs until we can see her in AM. If her condition deteriorates rapidly or there are urgent palliative needs please call [phone] for further orders.  Morphine bolus via infusion added- call palliative if more than 2 boluses are needed in 2 hours. Robinul for secretions. Haldol PRN for any terminal agitation that may occur. Spiritual care consult.  Lane Hacker, DO Palliative Medicine [phone]

## 2014-08-11 NOTE — Progress Notes (Addendum)
Called to assist at bedside with patient with onset of decreased O2 sats and decreased LOC.  Skin cool and dry and pale.  Minimally responsive -  Opens eyes to name -will nod occasionally to questions - nods no to pain - yes to tired - resps regular but shallow - bil BS with fine crackles through out -HR 57 - SB per monitor - will get 12 lead EKG - K+ 5.7 = according to chart has been elevated last 2 days requiring treatment - BP 133/75 RR 16 O2sats 100% on NRB mask - staff reports she was 86% on 2 liters before mask - abd soft nondistended - had liver mass bx yesterday.  CBG 155. Had Xanax 0.25 mg po last night around 9 pm - BUN/Creat normal - spoke with pharmacy - no recommendations for reversal at that one time dose.   Cecilie Kicks NP and Dr. Percival Spanish in to see patient.  No acute treatment for now.  Patient is currently LCB - Bipap and meds only - husband on way here - providers will discuss with him.  RN Romelle Starcher to call as needed.

## 2014-08-11 NOTE — Progress Notes (Signed)
PT Cancellation Note  Patient Details Name: DAGMAR ADCOX MRN: [id number] DOB: [birth year]   Cancelled Treatment:    Reason Eval/Treat Not Completed: Medical issues which prohibited therapy (K back to 5.7 and not appropriate at this time)   Melford Aase 2014/07/31, 7:26 AM Elwyn Reach, [place]

## 2014-08-11 NOTE — Care Management (Signed)
Important Message  Patient Details  Name: Alexa Middleton MRN: [id number] Date of Birth: 25-Oct-1927   Medicare Important Message Given:  Yes-third notification given    Nathen May 2014-08-16, 10:08 AM

## 2014-08-11 NOTE — Progress Notes (Signed)
Reviewed labwork and CT results. No acute stroke or mass noted on head CT. Ammonia level is 79, which could be contributing to AMS, but does not seem that it would cause her significant unresponsiveness. No clear response to normal saline, although sodium has improved to 128. Currently on NS at 75 cc/hr. Spoke with her husband about a possible laculose enema or providing it via NG tube, however, he feels that is too invasive and thinks that she is ready to die. He now wishes comfort care.  Will discuss with palliative care service, but plan to transfer to 6N. Will d/c meds and start low dose morphine, switch to nasal cannula. Not eating currently - I anticipate death during this hospitalization.  Pixie Casino, MD, [facility] Attending Cardiologist [place] [facility] [place]

## 2014-08-11 NOTE — Progress Notes (Signed)
DR. Hilma Favors and Dr. Sharolyn Douglas notified of death.

## 2014-08-11 NOTE — Progress Notes (Signed)
Called to room by NT to help patient get OOB to scale.  It was noted that when compared to last night, patient much weaker and needed +2 assistance with gait belt.  Patient stated she was not feeling well. When asked what she was feeling patient responded, "I feel like I am going to die".  Neuro assessment completed, other than some noted increased weakness to BLE-no changes from last night.  Vitals obtained, all WNL although patient Spo2 is 92-93% on o23l via nasal cannula. Patient is noted to have increased WOB at this time as well. Cardiology on call paged. MD to come assess. Will continue to monitor. Blood pressure 133/47, pulse 61, temperature 97.4 F (36.3 C), temperature source Oral, resp. rate 16, height 5\' 2"  (1.575 m), weight 45.405 kg (100 lb 1.6 oz), SpO2 93 %. Tresa Endo

## 2014-08-11 NOTE — Progress Notes (Addendum)
Pt transferred to 6N17 via bed from [place].  Pt unresponsive.  Pt on 2L O2 via [place].  Pt has 22G to Lt FA with fluids infusing.  Pt has foam dsg to LLE in place.  Family to bedside.  Report rcvd from [place], [place].  Will continue to monitor.

## 2014-08-11 NOTE — Progress Notes (Signed)
Pt has orders for transfer to 6N for comfort care. Report called to RN. Pt and family are ready for transfer.  Maurene Capes RN

## 2014-08-11 NOTE — Progress Notes (Signed)
Subjective: Minimal response  Objective: Vital signs in last 24 hours: Temp:  [97.4 F (36.3 C)-97.7 F (36.5 C)] 97.4 F (36.3 C) (07/08 0444) Pulse Rate:  [59-79] 61 (07/08 0444) Resp:  [16-18] 16 (07/08 0444) BP: (115-137)/(42-51) 133/47 mmHg (07/08 0444) SpO2:  [93 %-99 %] 96 % (07/08 0658) Weight:  [100 lb 1.6 oz (45.405 kg)] 100 lb 1.6 oz (45.405 kg) (07/08 0444) Weight change: 17 lb 12.8 oz (8.074 kg) Last BM Date: 07/17/14 Intake/Output from previous day: 07/07 0701 - 07/08 0700 In: 2 [P.O.:880] Out: -  Intake/Output this shift:    PE: General pt not responding except opening eyes at times to voice and touch Skin:Cool and dry, brisk capillary refill HEENT:normocephalic, sclera clear, mucus membranes moist Heart:S1S2 RRR without murmur, gallup, rub or click Lungs:clear ant without rales, rhonchi, or wheezes HQP:RFFM, non tender, + BS, do not palpate liver spleen or masses Ext:no lower ext edema, 2+ pedal pulses, 2+ radial pulses Neuro:poor response to voice and touch  Tele:  SB   EKG -SB 54 ant MI ? Age no acute changes  Lab Results:  Recent Labs  07/16/14 0318  WBC 12.8*  HGB 13.6  HCT 42.8  PLT 376   BMET  Recent Labs  07/17/14 1559 07/24/14 0451  NA 125* 124*  K 5.5* 5.7*  CL 79* 77*  CO2 38* 43*  GLUCOSE 185* 133*  BUN 28* 23*  CREATININE 0.51 0.44  CALCIUM 8.3* 8.2*   No results for input(s): TROPONINI in the last 72 hours.  Invalid input(s): CK, MB  No results found for: CHOL, HDL, LDLCALC, LDLDIRECT, TRIG, CHOLHDL Lab Results  Component Value Date   HGBA1C 6.4* 09/07/2013     Lab Results  Component Value Date   TSH 1.583 07/14/2014       Studies/Results: US Biopsy  07/16/2014   CLINICAL DATA:  Multiple left hepatic lesions concerning for metastatic disease versus multifocal hepatocellular carcinoma. No known primary.  EXAM: ULTRASOUND GUIDED CORE BIOPSY OF LEFT HEPATIC MASS  MEDICATIONS: 0.5 mg IV Versed;  712.5 mcg IV Fentanyl  Total Moderate Sedation Time: 15  PROCEDURE: The procedure, risks, benefits, and alternatives were explained to the patient. Questions regarding the procedure were encouraged and answered. The patient understands and consents to the procedure.  The subxiphoid region was prepped with ChloraPrep in a sterile fashion, and a sterile drape was applied covering the operative field. A sterile gown and sterile gloves were used for the procedure. Local anesthesia was provided with 1% Lidocaine.  Previous imaging reviewed. Preliminary ultrasound performed. Numerous left hepatic solid hypoechoic masses were demonstrated through a subxiphoid window. Under sterile conditions and local anesthesia, a 17 gauge 6.8 cm access needle was advanced percutaneously under direct ultrasound into a hypoechoic mass in the left hepatic lobe lateral segment. Needle position confirmed with CT. Three core biopsies obtained through the access under direct ultrasound. Needle removed. Samples placed in formalin. Samples were intact and non fragmented. Needle tract embolized with Gel-Foam pledgets.  COMPLICATIONS: None immediate  FINDINGS: Imaging confirms needle placement in a left hepatic mass for core biopsy  IMPRESSION: Successful ultrasound left hepatic mass 18 gauge core biopsies   Electronically Signed   By: Jerilynn Mages.  Shick M.D.   On: 07/16/2014 12:26    Medications: I have reviewed the patient's current medications. Scheduled Meds: . antiseptic oral rinse  7 mL Mouth Rinse BID  . apixaban  2.5 mg Oral BID  .  atorvastatin  10 mg Oral q1800  . diltiazem  240 mg Oral Daily  . feeding supplement (ENSURE ENLIVE)  237 mL Oral BID BM  . furosemide  20 mg Oral Daily  . lactose free nutrition  237 mL Oral TID WC  . polyethylene glycol  17 g Oral Daily  . sotalol  80 mg Oral Q12H  . white petrolatum  1 application Topical Daily   Continuous Infusions: . sodium chloride 10 mL/hr at 07/16/14 0530   PRN  Meds:.acetaminophen, ALPRAZolam, bisacodyl, nitroGLYCERIN, ondansetron (ZOFRAN) IV, zolpidem  Assessment/Plan: Principal Problem:   Acute diastolic CHF (congestive heart failure), NYHA class 4 Active Problems:   HTN (hypertension)   Hyperlipidemia   PAF (paroxysmal atrial fibrillation)   Liver mass  Altered mental status:   Pt weaker this AM she stated she thought she was going to die.  spo2 to 85% now with non rebreather.  spo2 99% check CT head check cbc did receive eliquis last pm check LFTs.  Received xanax last pm but low dose discussed with pharmacy and doubt in system now.  Discussed with husband will not call code and only give meds as needed.  Will monitor and check ammonia, cbc Dr. Debara Pickett talking with Husband.  Paroxysmal atrial fibrillation/flutter:  - SB 58 - Asymptomatic. - Lopressor 12.5 mg PO q6 hours . Already on po Dilt. She has prev been on amio (summer/fall 2015) and the dose was dropped in 10/2013 2/2 marked LFT elevation. Amio was d/c'd in Nov 2015 and LFT's normalized (nl this admission). Thus amio is unlikely to be a good choice going forward. now on sotalol-EP has seen. - CHADS -VASC score is 5. On Eliquis for anti-coagulation.  -  Eliquis resumed.  2. Acute on chronic diastolic CHF:  - Good response to diuresis with IV Lasix. negative 3598 since admit, + 800 yesterday- Weight stable over the last 2 days.wts are all over the place ? 18 lb wt gain in 1 day? - Restart Lasix 20 mg PO daily (was taking prn).  - Normal EF, grade 1 diastolic dysfunction in August 2015. Repeat echo yesterday essentially unchanged from prior. No WMA.   3. Elevated troponin:  - Marginal increase in plateau pattern may be secondary to arrhythmia.  - No further ischemic workup. LV function unchanged.   4. Elevated d-dimer with asymmetrical right lower extremity edema:  - No DVT by [place] - chronic eliquis in setting of af/flutter.  5. Liver mass:  - US guided biopsy done  results pending   6. Acute on chronic hypercapnic respiratory failure: - Resolved; suspect underlying chronic respiratory acidosis and compensatory metabolic alkalosis; remote history of 30-pack year smoking, exacerbation due to CHF.   7. Hyponatremia: - Stable. - Restart Lasix 20 mg PO daily.  8. Hypokalemia: - K+ 5.7.   9. Deconditioning: today with AMS - Very weak. Recent significant weight loss.  - Possibly related to malignancy.  - Husband does not believe he can take care of her at home.  - May require SNF placement.  - PT/OT/Case mgmt following.   LOS: 7 days   Time spent with pt. :15  minutes. [facility] R  Nurse Practitioner Certified Pager [phone] or after 5pm and on weekends call [phone] 2014-08-03, 7:33 AM

## 2014-08-11 NOTE — Progress Notes (Signed)
Notified of biopsy results by pathology today. This showed adenocarcinoma. Pt is now on comfort care.   Alexa Middleton Aug 16, 2014 4:57 PM

## 2014-08-11 DEATH — deceased

## 2014-09-01 ENCOUNTER — Ambulatory Visit: Payer: Self-pay | Admitting: Cardiology

## 2014-09-11 ENCOUNTER — Other Ambulatory Visit: Payer: Self-pay | Admitting: Nurse Practitioner

## 2016-08-27 IMAGING — CT CT ABD-PELV W/ CM
2 of 5 series · 10 of 36 positions shown, 17 images · IV contrast (READICAT/WATER & [ID] ISOVUE 300)
Comparison: Ultrasound exam from 06/24/2014.

CLINICAL DATA: Subsequent encounter for weight loss with lesions
seen in left hepatic lobe on recent ultrasound exam.

EXAM:
CT ABDOMEN AND PELVIS WITH CONTRAST
TECHNIQUE: Multidetector CT imaging of the abdomen and pelvis was performed
using the standard protocol following bolus administration of
intravenous contrast.
CONTRAST:  100mL 8COMBH-Y55 IOPAMIDOL (8COMBH-Y55) INJECTION 61%

[Series 3: abd/pelvis with · axial · 0.62mm/px · z∈[-358,-38]mm · 9 of 82 slices shown, 15 images]
[im 9/82  soft-tissue]
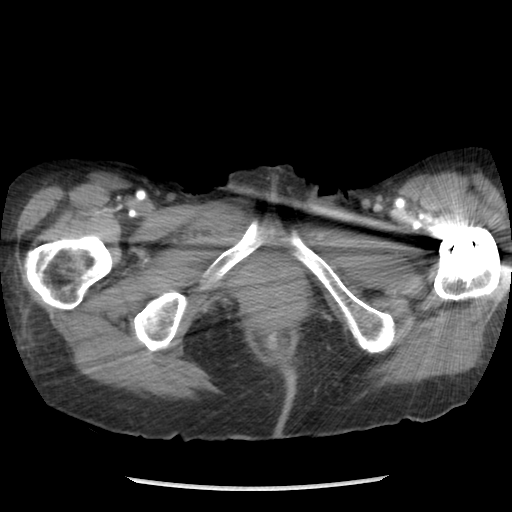
[im 9/82  bone]
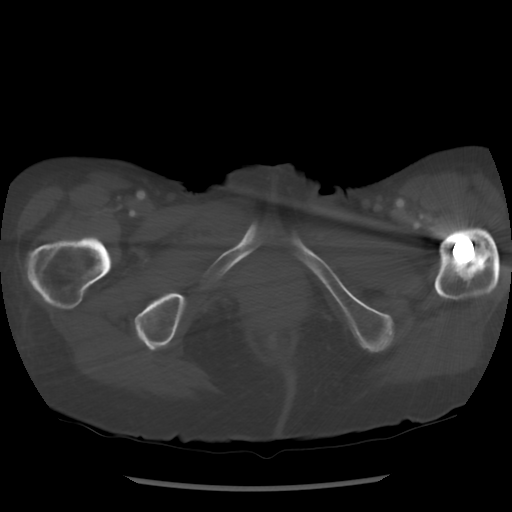
[im 17/82  soft-tissue]
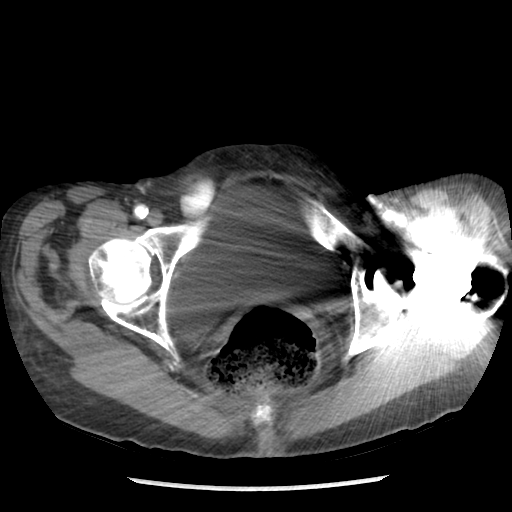
[im 25/82  soft-tissue]
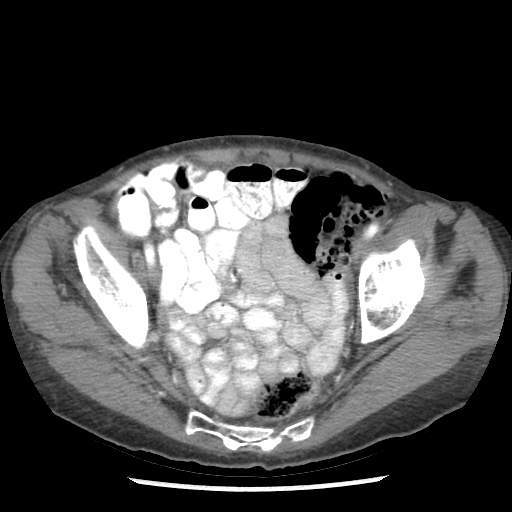
[im 33/82  soft-tissue]
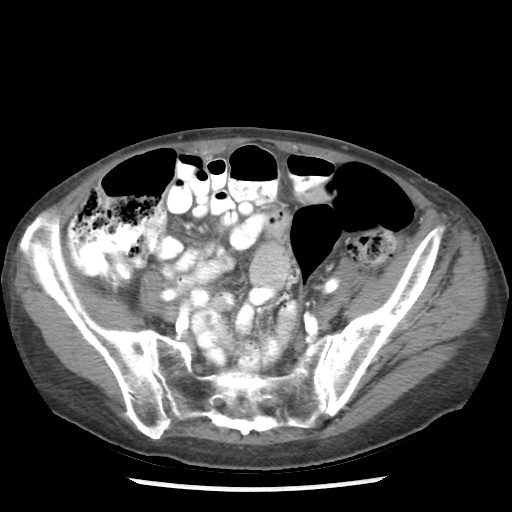
[im 41/82  soft-tissue]
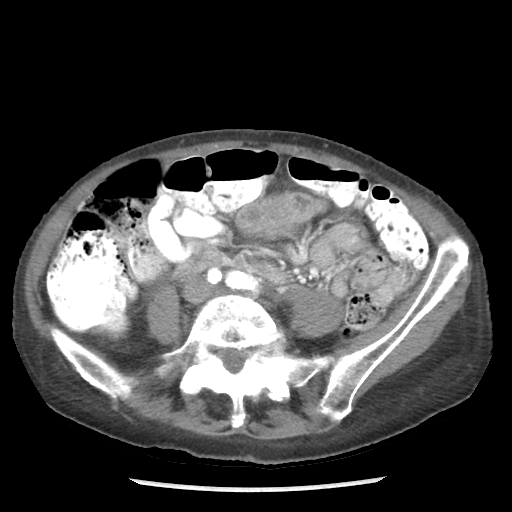
[im 49/82  soft-tissue]
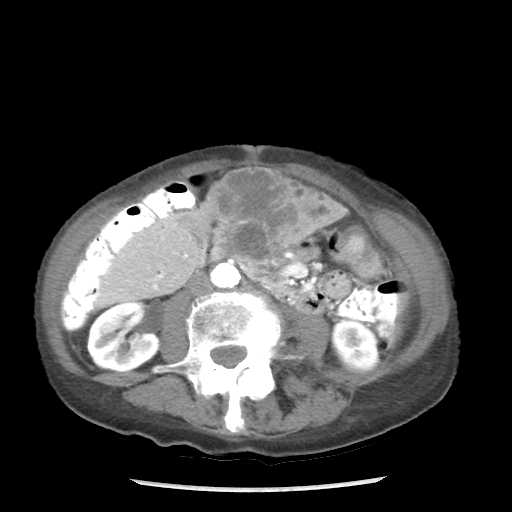
[im 49/82  lung]
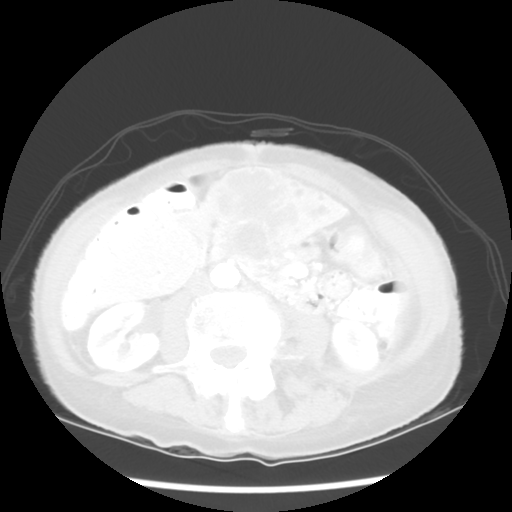
[im 57/82  soft-tissue]
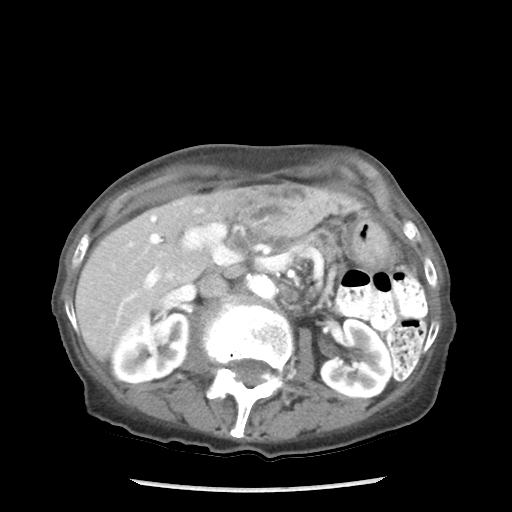
[im 57/82  lung]
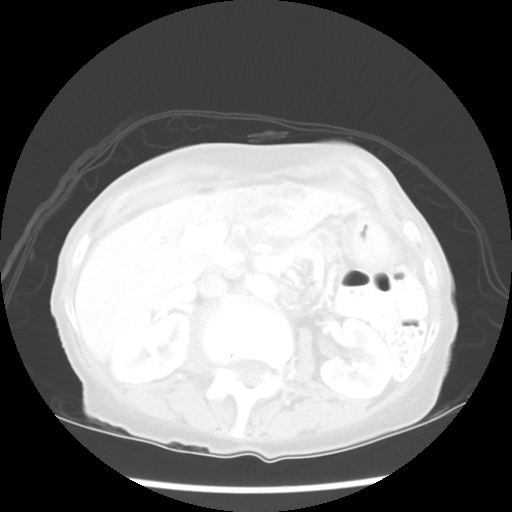
[im 65/82  soft-tissue]
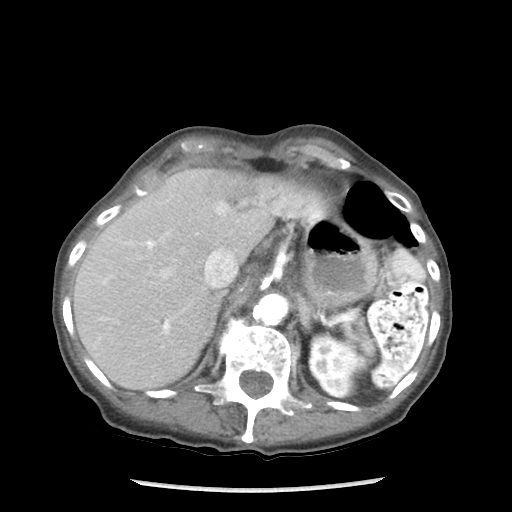
[im 65/82  lung]
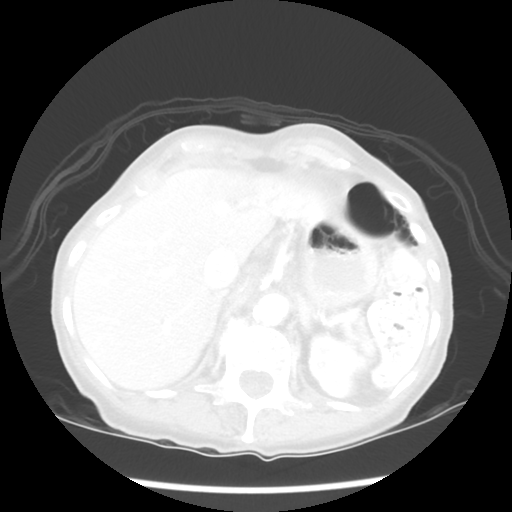
[im 73/82  soft-tissue]
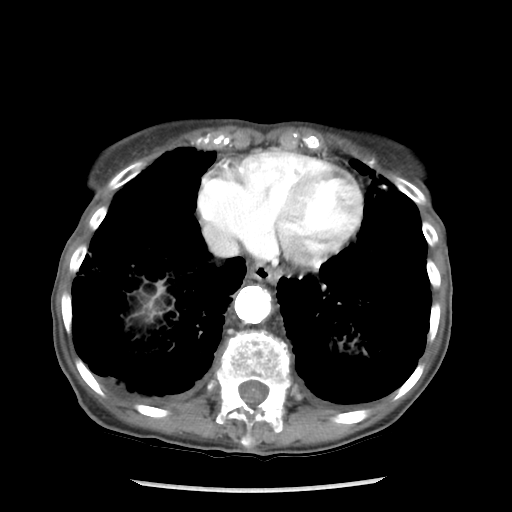
[im 73/82  lung]
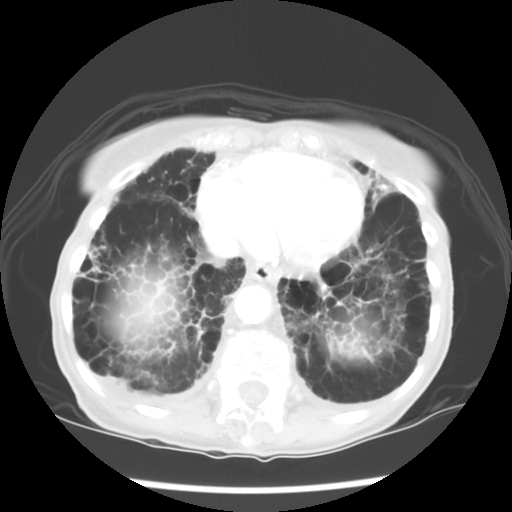
[im 73/82  bone]
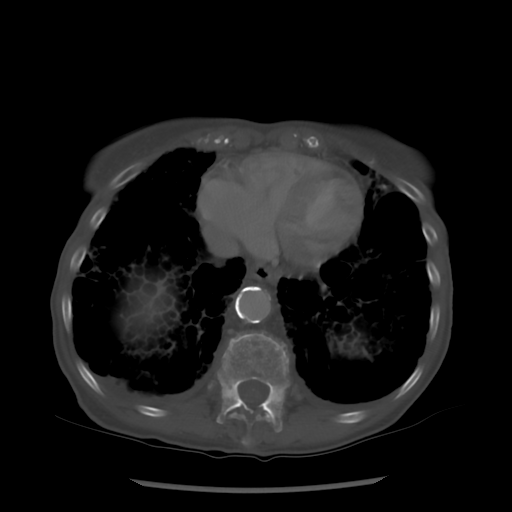

[Series 601: coronal body · coronal · 0.80mm/px · 1 of 99 slices shown, 2 images]
[im 33/99  soft-tissue]
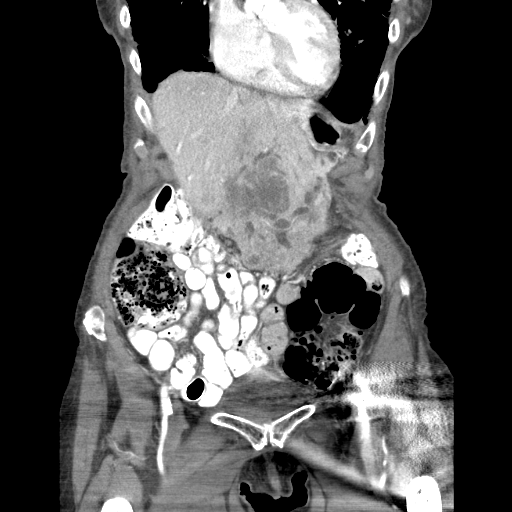
[im 33/99  bone]
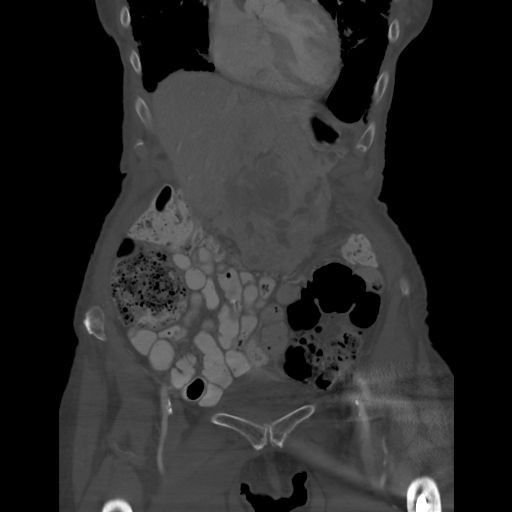

[10 of 36 positions shown; findings below may reference images not displayed]

FINDINGS: Lower chest: Advanced changes of centrilobular and paraseptal
emphysema.

Hepatobiliary: As seen on ultrasound, there are multiple lesions
involving the left liver. The dominant heterogeneously enhancing
lesion measures 3.3 x 5.5 cm. Other lesions in the left liver range
in size from 10 mm up to about 20 mm. This is associated with
intrahepatic biliary duct dilatation mainly involving the lateral
segment left liver.

Gallbladder is surgically absent. There is no extrahepatic biliary
duct dilatation.

Pancreas: There is mild diffuse distention of the main pancreatic
duct. Pancreatic head is not well discriminated secondary to
distortion of central abdominal anatomy from the left hepatic lobe
enlargement and lack of intra-abdominal fat.

Spleen: No splenomegaly. No focal mass lesion.

Adrenals/Urinary Tract: No adrenal nodule or mass. No enhancing
lesion or hydronephrosis in either kidney. No evidence for
hydroureter. Urinary bladder is unremarkable.

Stomach/Bowel: Stomach is nondistended. Duodenum is distorted by
mass effect from the left hepatic lobe mass, but there is no
duodenal obstruction. No small bowel wall thickening. No small bowel
dilatation. Terminal ileum is normal. The appendix is not
visualized, but there is no edema or inflammation in the region of
the cecum. Diverticular changes are noted in the left colon without
evidence of diverticulitis.

Vascular/Lymphatic: There is abdominal aortic atherosclerosis
without aneurysm.

Necrotic lymphadenopathy is seen in the hepatoduodenal ligament.
x 2.5 cm lymph node is seen on image 24. 1.4 x 2.3 cm aortocaval
lymph node is visible on image 23. There is some abnormal soft
tissue attenuation around the celiac axis with some small lymph
nodes seen in the celiac axis. 8 mm short axis left para-aortic
lymph node is associated. No evidence for pelvic sidewall
lymphadenopathy.

Reproductive: Uterus is surgically absent. No evidence for adnexal
mass.

Other: No intraperitoneal free fluid.

Musculoskeletal: Patient is status post left total hip replacement.
Bones are diffusely demineralized.
IMPRESSION: Multiple bulky lesions mainly in the lateral segment left liver with
some involvement of the medial segment left liver. This is
associated with necrotic lymphadenopathy in the hepatoduodenal
ligament and retroperitoneal space of the abdomen. Main differential
considerations would include a primary hepatic neoplasm (HCC)
although no overt sequelae of cirrhosis are evident. Peripheral
cholangiocarcinoma would be a consideration. Metastatic disease from
nonvisualized primary (such as pancreatic, GI, or breast) would also
be a consideration. The left hepatic lesion should be amenable to
percutaneous tissue sampling, as clinically warranted.
